# Patient Record
Sex: Female | Born: 1990 | Race: Black or African American | Hispanic: No | Marital: Single | Smoking: Current every day smoker
Health system: Southern US, Community
[De-identification: ages and names within clinical notes are randomized; demographics above are authoritative.]

## PROBLEM LIST (undated history)

## (undated) DIAGNOSIS — M419 Scoliosis, unspecified: Secondary | ICD-10-CM

## (undated) DIAGNOSIS — M069 Rheumatoid arthritis, unspecified: Secondary | ICD-10-CM

## (undated) DIAGNOSIS — M0800 Unspecified juvenile rheumatoid arthritis of unspecified site: Secondary | ICD-10-CM

## (undated) DIAGNOSIS — J45909 Unspecified asthma, uncomplicated: Secondary | ICD-10-CM

## (undated) DIAGNOSIS — A609 Anogenital herpesviral infection, unspecified: Secondary | ICD-10-CM

## (undated) DIAGNOSIS — A749 Chlamydial infection, unspecified: Secondary | ICD-10-CM

## (undated) HISTORY — DX: Scoliosis, unspecified: M41.9

## (undated) HISTORY — DX: Chlamydial infection, unspecified: A74.9

## (undated) HISTORY — DX: Unspecified juvenile rheumatoid arthritis of unspecified site: M08.00

## (undated) HISTORY — DX: Anogenital herpesviral infection, unspecified: A60.9

---
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
## 2012-02-02 ENCOUNTER — Emergency Department (HOSPITAL_COMMUNITY)
Admission: EM | Admit: 2012-02-02 | Discharge: 2012-02-02 | Disposition: A | Discharge status: Nursing Home (Includes ICF) | Payer: Managed Care, Other (non HMO) | Source: Home / Self Care | Attending: Family Medicine | Admitting: Family Medicine

## 2012-02-02 ENCOUNTER — Encounter (HOSPITAL_COMMUNITY): Payer: Self-pay | Admitting: *Deleted

## 2012-02-02 ENCOUNTER — Emergency Department (HOSPITAL_COMMUNITY): Payer: Managed Care, Other (non HMO)

## 2012-02-02 ENCOUNTER — Emergency Department (HOSPITAL_COMMUNITY)
Admission: EM | Admit: 2012-02-02 | Discharge: 2012-02-02 | Disposition: A | Discharge status: Home / Self Care | Payer: Managed Care, Other (non HMO) | Attending: Emergency Medicine | Admitting: Emergency Medicine

## 2012-02-02 ENCOUNTER — Emergency Department (INDEPENDENT_AMBULATORY_CARE_PROVIDER_SITE_OTHER): Payer: Managed Care, Other (non HMO)

## 2012-02-02 DIAGNOSIS — H538 Other visual disturbances: Secondary | ICD-10-CM | POA: Insufficient documentation

## 2012-02-02 DIAGNOSIS — S60221A Contusion of right hand, initial encounter: Secondary | ICD-10-CM

## 2012-02-02 DIAGNOSIS — R221 Localized swelling, mass and lump, neck: Secondary | ICD-10-CM | POA: Insufficient documentation

## 2012-02-02 DIAGNOSIS — H5710 Ocular pain, unspecified eye: Secondary | ICD-10-CM | POA: Insufficient documentation

## 2012-02-02 DIAGNOSIS — S60229A Contusion of unspecified hand, initial encounter: Secondary | ICD-10-CM

## 2012-02-02 DIAGNOSIS — F17200 Nicotine dependence, unspecified, uncomplicated: Secondary | ICD-10-CM | POA: Insufficient documentation

## 2012-02-02 DIAGNOSIS — S0993XA Unspecified injury of face, initial encounter: Secondary | ICD-10-CM

## 2012-02-02 DIAGNOSIS — J45909 Unspecified asthma, uncomplicated: Secondary | ICD-10-CM | POA: Insufficient documentation

## 2012-02-02 DIAGNOSIS — R220 Localized swelling, mass and lump, head: Secondary | ICD-10-CM | POA: Insufficient documentation

## 2012-02-02 DIAGNOSIS — H5789 Other specified disorders of eye and adnexa: Secondary | ICD-10-CM | POA: Insufficient documentation

## 2012-02-02 HISTORY — DX: Unspecified asthma, uncomplicated: J45.909

## 2012-02-02 MED ORDER — HYDROCODONE-ACETAMINOPHEN 5-325 MG PO TABS: 2.0000 | ORAL_TABLET | ORAL | Status: DC | PRN

## 2012-02-02 MED ORDER — TETRACAINE HCL 0.5 % OP SOLN
OPHTHALMIC | Status: AC
  Filled 2012-02-02: qty 2

## 2012-02-02 MED ORDER — HYDROCODONE-ACETAMINOPHEN 5-325 MG PO TABS
2.0000 | ORAL_TABLET | Freq: Once | ORAL | Status: AC
  Administered 2012-02-02: 2 via ORAL
  Filled 2012-02-02: qty 2

## 2012-02-02 MED ORDER — FLUORESCEIN SODIUM 1 MG OP STRP
ORAL_STRIP | OPHTHALMIC | Status: AC
  Administered 2012-02-02: 1
  Filled 2012-02-02: qty 1

## 2012-02-02 NOTE — ED Notes (Signed)
Report called to Desma Paganini, ED First Nurse.

## 2012-02-02 NOTE — ED Provider Notes (Signed)
Medical screening examination/treatment/procedure(s) were performed by resident physician or non-physician practitioner and as supervising physician I was immediately available for consultation/collaboration.   Alison Kubicki DOUGLAS MD.    Azarie Coriz D Willmer Fellers, MD 02/02/12 1940 

## 2012-02-02 NOTE — ED Notes (Signed)
States last tetanus < 5 yrs ago.
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
## 2012-06-20 ENCOUNTER — Encounter: Payer: Managed Care, Other (non HMO) | Admitting: Gynecology

## 2012-06-24 ENCOUNTER — Encounter (HOSPITAL_COMMUNITY): Payer: Self-pay | Admitting: Emergency Medicine

## 2012-06-24 ENCOUNTER — Emergency Department (HOSPITAL_COMMUNITY): Payer: Managed Care, Other (non HMO)

## 2012-06-24 ENCOUNTER — Emergency Department (HOSPITAL_COMMUNITY)
Admission: EM | Admit: 2012-06-24 | Discharge: 2012-06-24 | Disposition: A | Discharge status: Home / Self Care | Payer: Managed Care, Other (non HMO) | Attending: Emergency Medicine | Admitting: Emergency Medicine

## 2012-06-24 DIAGNOSIS — Z79899 Other long term (current) drug therapy: Secondary | ICD-10-CM | POA: Insufficient documentation

## 2012-06-24 DIAGNOSIS — R109 Unspecified abdominal pain: Secondary | ICD-10-CM | POA: Insufficient documentation

## 2012-06-24 DIAGNOSIS — N939 Abnormal uterine and vaginal bleeding, unspecified: Secondary | ICD-10-CM | POA: Insufficient documentation

## 2012-06-24 DIAGNOSIS — N926 Irregular menstruation, unspecified: Secondary | ICD-10-CM | POA: Insufficient documentation

## 2012-06-24 DIAGNOSIS — J45909 Unspecified asthma, uncomplicated: Secondary | ICD-10-CM | POA: Insufficient documentation

## 2012-06-24 DIAGNOSIS — Z3202 Encounter for pregnancy test, result negative: Secondary | ICD-10-CM | POA: Insufficient documentation

## 2012-06-24 DIAGNOSIS — Z8619 Personal history of other infectious and parasitic diseases: Secondary | ICD-10-CM | POA: Insufficient documentation

## 2012-06-24 DIAGNOSIS — M083 Juvenile rheumatoid polyarthritis (seronegative): Secondary | ICD-10-CM | POA: Insufficient documentation

## 2012-06-24 DIAGNOSIS — Z8739 Personal history of other diseases of the musculoskeletal system and connective tissue: Secondary | ICD-10-CM | POA: Insufficient documentation

## 2012-06-24 DIAGNOSIS — Z8679 Personal history of other diseases of the circulatory system: Secondary | ICD-10-CM | POA: Insufficient documentation

## 2012-06-24 DIAGNOSIS — F17200 Nicotine dependence, unspecified, uncomplicated: Secondary | ICD-10-CM | POA: Insufficient documentation

## 2012-06-24 LAB — URINE MICROSCOPIC-ADD ON

## 2012-06-24 LAB — URINALYSIS, ROUTINE W REFLEX MICROSCOPIC
Ketones, ur: NEGATIVE mg/dL
Leukocytes, UA: NEGATIVE
Nitrite: NEGATIVE
Protein, ur: NEGATIVE mg/dL
Urobilinogen, UA: 0.2 mg/dL (ref 0.0–1.0)

## 2012-06-24 LAB — CBC WITH DIFFERENTIAL/PLATELET
Basophils Absolute: 0 10*3/uL (ref 0.0–0.1)
HCT: 44 % (ref 36.0–46.0)
Hemoglobin: 15.4 g/dL — ABNORMAL HIGH (ref 12.0–15.0)
Lymphocytes Relative: 25 % (ref 12–46)
Monocytes Absolute: 0.6 10*3/uL (ref 0.1–1.0)
Monocytes Relative: 7 % (ref 3–12)
Neutro Abs: 5 10*3/uL (ref 1.7–7.7)
WBC: 7.7 10*3/uL (ref 4.0–10.5)

## 2012-06-24 LAB — POCT I-STAT, CHEM 8
HCT: 46 % (ref 36.0–46.0)
Hemoglobin: 15.6 g/dL — ABNORMAL HIGH (ref 12.0–15.0)
Potassium: 3.9 mEq/L (ref 3.5–5.1)
Sodium: 141 mEq/L (ref 135–145)
TCO2: 25 mmol/L (ref 0–100)

## 2012-06-24 LAB — GC/CHLAMYDIA PROBE AMP: GC Probe RNA: NEGATIVE

## 2012-06-24 MED ORDER — IBUPROFEN 600 MG PO TABS: 600.0000 mg | ORAL_TABLET | Freq: Four times a day (QID) | ORAL | Status: DC | PRN

## 2012-06-24 NOTE — ED Provider Notes (Signed)
Medical screening examination/treatment/procedure(s) were performed by non-physician practitioner and as supervising physician I was immediately available for consultation/collaboration.  Ossie Yebra, MD 06/24/12 1447 
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
## 2012-06-24 NOTE — ED Notes (Signed)
States history of heavy menstrual cycle and since Feb 14 this year constant vaginal bleeding and intermittent abdominal pain LLQ and RLQ 3-10/10 cramping.

## 2012-09-03 ENCOUNTER — Ambulatory Visit (INDEPENDENT_AMBULATORY_CARE_PROVIDER_SITE_OTHER): Payer: Managed Care, Other (non HMO) | Admitting: Gynecology

## 2012-09-03 ENCOUNTER — Encounter: Payer: Self-pay | Admitting: Gynecology

## 2012-09-03 DIAGNOSIS — N750 Cyst of Bartholin's gland: Secondary | ICD-10-CM

## 2012-09-03 NOTE — Patient Instructions (Signed)
Use sitz baths with warm water over the next day or 2. Followup if the area becomes more tender, swollen or any other issue.

## 2012-09-03 NOTE — Progress Notes (Signed)
Patient ID: Diane Glass, female   DOB: 10-01-1990, 22 y.o.   MRN: [id number] Patient presents with several day history of noticing swelling around the right area of her vagina. It is not painful nor draining. Never had this before.  Exam with Selena Batten assistant External BUS vagina with Bartholin's cyst right lower labia/perineal body. 2-3 cm nontender. No inguinal adenopathy or tenderness. Vaginal exam without other abnormalities. Cervix normal. Uterus normal size mobile nontender. Adnexa without masses or tenderness. Physical Exam  Genitourinary:      Procedure note: Area on inner lower aspect right labium minora/posterior fourchette was cleansed with Betadine a pointing area was infiltrated using 1% lidocaine and a stab incision made with a scalpel. Clear mucoid material extruded until the cyst was completely emptied. No residual mass.  Assessment and plan: Right Bartholin's cyst, incised and drained. No residual mass. Postoperative instructions given. Followup if recurrence. Discussed possible marsupialization if it does become recurrent.

## 2012-10-01 ENCOUNTER — Encounter: Payer: Self-pay | Admitting: Gynecology

## 2012-10-01 ENCOUNTER — Ambulatory Visit (INDEPENDENT_AMBULATORY_CARE_PROVIDER_SITE_OTHER): Payer: Managed Care, Other (non HMO) | Admitting: Gynecology

## 2012-10-01 VITALS — BP 112/66 | Ht 62.0 in | Wt 123.0 lb

## 2012-10-01 DIAGNOSIS — Z113 Encounter for screening for infections with a predominantly sexual mode of transmission: Secondary | ICD-10-CM

## 2012-10-01 DIAGNOSIS — N750 Cyst of Bartholin's gland: Secondary | ICD-10-CM

## 2012-10-01 DIAGNOSIS — R21 Rash and other nonspecific skin eruption: Secondary | ICD-10-CM

## 2012-10-01 DIAGNOSIS — Z01419 Encounter for gynecological examination (general) (routine) without abnormal findings: Secondary | ICD-10-CM

## 2012-10-01 LAB — COMPREHENSIVE METABOLIC PANEL
ALT: 8 U/L (ref 0–35)
AST: 14 U/L (ref 0–37)
Albumin: 4.7 g/dL (ref 3.5–5.2)
Alkaline Phosphatase: 58 U/L (ref 39–117)
BUN: 10 mg/dL (ref 6–23)
CO2: 27 mEq/L (ref 19–32)
Calcium: 9.6 mg/dL (ref 8.4–10.5)
Chloride: 106 mEq/L (ref 96–112)
Creat: 0.69 mg/dL (ref 0.50–1.10)
Glucose, Bld: 70 mg/dL (ref 70–99)
Potassium: 4.5 mEq/L (ref 3.5–5.3)
Sodium: 141 mEq/L (ref 135–145)
Total Bilirubin: 0.3 mg/dL (ref 0.3–1.2)
Total Protein: 7 g/dL (ref 6.0–8.3)

## 2012-10-01 LAB — CBC WITH DIFFERENTIAL/PLATELET
Basophils Absolute: 0.1 10*3/uL (ref 0.0–0.1)
Basophils Relative: 1 % (ref 0–1)
Eosinophils Absolute: 0.2 10*3/uL (ref 0.0–0.7)
Eosinophils Relative: 3 % (ref 0–5)
HCT: 42.5 % (ref 36.0–46.0)
Hemoglobin: 14.8 g/dL (ref 12.0–15.0)
Lymphocytes Relative: 26 % (ref 12–46)
Lymphs Abs: 1.9 10*3/uL (ref 0.7–4.0)
MCH: 31 pg (ref 26.0–34.0)
MCHC: 34.8 g/dL (ref 30.0–36.0)
MCV: 88.9 fL (ref 78.0–100.0)
Monocytes Absolute: 0.5 10*3/uL (ref 0.1–1.0)
Monocytes Relative: 7 % (ref 3–12)
Neutro Abs: 4.5 10*3/uL (ref 1.7–7.7)
Neutrophils Relative %: 63 % (ref 43–77)
Platelets: 247 10*3/uL (ref 150–400)
RBC: 4.78 MIL/uL (ref 3.87–5.11)
RDW: 13.4 % (ref 11.5–15.5)
WBC: 7.2 10*3/uL (ref 4.0–10.5)

## 2012-10-01 LAB — HEPATITIS B SURFACE ANTIGEN: Hepatitis B Surface Ag: NEGATIVE

## 2012-10-01 LAB — HIV ANTIBODY (ROUTINE TESTING W REFLEX): HIV: NONREACTIVE

## 2012-10-01 LAB — RPR

## 2012-10-01 LAB — HEPATITIS C ANTIBODY: HCV Ab: NEGATIVE

## 2012-10-01 LAB — TSH: TSH: 0.582 u[IU]/mL (ref 0.350–4.500)

## 2012-10-01 NOTE — Patient Instructions (Addendum)
Call if you want to schedule surgery for the Bartholin's cyst. Followup for Gardasil series if you choose. Followup for alternative birth control discussion if you choose. Followup for lab results on Mychart. Followup in one year for annual exam.

## 2012-10-01 NOTE — Progress Notes (Signed)
Diane Glass [birth year] [id number]        22 y.o.  G0P0 for annual exam.  Several issues noted below.  Past medical history,surgical history, medications, allergies, family history and social history were all reviewed and documented in the EPIC chart.  ROS:  Performed and pertinent positives and negatives are included in the history, assessment and plan .  Exam: Kim assistant Filed Vitals:   10/01/12 1406  BP: 112/66  Height: 5\' 2"  (1.575 m)  Weight: 123 lb (55.792 kg)   General appearance  Normal Skin grossly normal excepting small crusting rash several millimeters across the lower anterior left shin Head/Neck normal with no cervical or supraclavicular adenopathy thyroid normal Lungs  clear Cardiac RR, without RMG Abdominal  soft, nontender, without masses, organomegaly or hernia Breasts  examined lying and sitting without masses, retractions, discharge or axillary adenopathy. Pelvic  Ext/BUS/vagina  2 cm soft classic Bartholin cyst right lower introital opening   Cervix  normal GC/Chlamydia  Uterus  anteverted, normal size, shape and contour, midline and mobile nontender   Adnexa  Without masses or tenderness    Anus and perineum  normal       Assessment/Plan:  22 y.o. G0P0 female for annual exam, regular menses, condom birth control  1. Barrier contraception. I again reviewed with her the issues and risks of barrier contraception to include failure. I reviewed options and patient declined. She is comfortable with condoms and accepts and understands the failure risk. Availability of plan B. discussed. 2. Right Bartholin cyst. Was opened mid May and drained. Patient said that reaccumulated then spontaneously drained once and now has reaccumulated. Does not hurt is not overly bothersome to her. Options of continued observation as long as it stays stable versus marsupialization discussed. I reviewed which involved with marsupialization and the patient will decide if she wants to  proceed with this. She does she knows to call me. 3. Rash left shin. Present for 2 weeks. Questionable poison ivy historically. Recommended OTC 1% hydrocortisone cream 3 times a day. If it persists recommended following up with dermatology. 4. STD screening. Patient requests STD screening. No known exposure but just wants to be screened. GC/Chlamydia, HIV, RPR, hepatitis B, hepatitis C done. 5. Pap smear. Will initiate at age 30 per current screening guidelines. 6. Breast health. SBE monthly reviewed. 7. Gardasil vaccine. I again reviewed availability and recommendation for Gardasil vaccine. Patient stated that she is reviewed this before and is not interested at this time. I've encouraged her to consider this and follow up if she wants to pursue the series. 8. Health maintenance. Baseline CBC comprehensive metabolic panel TSH urinalysis ordered. Due to her rheumatoid arthritis I ordered a TSH to rule out thyroid dysfunction and the glucose to rule out diabetes. We'll hold on lipids given her age and otherwise health status.    Dara Lords MD, 2:49 PM 10/01/2012

## 2012-10-02 LAB — URINALYSIS W MICROSCOPIC + REFLEX CULTURE
Hgb urine dipstick: NEGATIVE
Ketones, ur: NEGATIVE mg/dL
Nitrite: POSITIVE — AB
Urobilinogen, UA: 0.2 mg/dL (ref 0.0–1.0)

## 2012-10-02 LAB — GC/CHLAMYDIA PROBE AMP: GC Probe RNA: NEGATIVE

## 2012-10-04 LAB — URINE CULTURE

## 2012-10-06 ENCOUNTER — Other Ambulatory Visit: Payer: Self-pay | Admitting: Gynecology

## 2012-10-06 MED ORDER — NITROFURANTOIN MONOHYD MACRO 100 MG PO CAPS: 100.0000 mg | ORAL_CAPSULE | Freq: Two times a day (BID) | ORAL | Status: DC

## 2012-10-06 MED ORDER — SULFAMETHOXAZOLE-TRIMETHOPRIM 800-160 MG PO TABS: 1.0000 | ORAL_TABLET | Freq: Two times a day (BID) | ORAL | Status: AC

## 2012-10-07 ENCOUNTER — Other Ambulatory Visit: Payer: Self-pay | Admitting: Gynecology

## 2012-10-07 DIAGNOSIS — N390 Urinary tract infection, site not specified: Secondary | ICD-10-CM

## 2013-02-03 ENCOUNTER — Ambulatory Visit (INDEPENDENT_AMBULATORY_CARE_PROVIDER_SITE_OTHER): Payer: Managed Care, Other (non HMO) | Admitting: Gynecology

## 2013-02-03 ENCOUNTER — Encounter: Payer: Self-pay | Admitting: Gynecology

## 2013-02-03 DIAGNOSIS — Z113 Encounter for screening for infections with a predominantly sexual mode of transmission: Secondary | ICD-10-CM

## 2013-02-03 DIAGNOSIS — N750 Cyst of Bartholin's gland: Secondary | ICD-10-CM

## 2013-02-03 DIAGNOSIS — N72 Inflammatory disease of cervix uteri: Secondary | ICD-10-CM

## 2013-02-03 DIAGNOSIS — N765 Ulceration of vagina: Secondary | ICD-10-CM

## 2013-02-03 MED ORDER — VALACYCLOVIR HCL 500 MG PO TABS: 500.0000 mg | ORAL_TABLET | Freq: Two times a day (BID) | ORAL | Status: DC

## 2013-02-03 MED ORDER — VALACYCLOVIR HCL 1 G PO TABS: 1000.0000 mg | ORAL_TABLET | Freq: Two times a day (BID) | ORAL | Status: DC

## 2013-02-03 NOTE — Progress Notes (Signed)
Patient presents complaining of a swollen tender area on her vulva. Also a recurrence of her right Bartholin's cyst that was I&D May 2014. She she did request an STD check although has no known exposure wants to make sure.  Exam with Selena Batten Assistant Abdomen soft nontender without masses guarding rebound organomegaly. External BUS vagina with swollen tender right inguinal lymph node the overlying changes to suggest cellulitis/boil. Tender ulcer left of her urethral opening. 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 Vagina normal. Cervix normal. Uterus normal size midline mobile nontender. Adnexa without masses or tenderness  Assessment and plan: 1. 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 Swollen right inguinal lymph node most likely secondary to HSV although on the contralateral side. PCR HSV screen taken. Initiate Valtrex 1 g twice a day x10 days. Followup for PCR results. If positive patient understands implications. Options for daily suppressive therapy to prevent recurrence and possible transmission risk versus observation with reporting anymore outbreaks discussed. If negative then recommend observation with reporting of any recurrent outbreaks. 2. Recurrent right Bartholin's cyst. Not overly bothersome to the patient and she prefers observation at this time. Options for marsupialization or lancing discussed and declined. 3. STD screening. GC/Chlamydia of the cervix, RPR, HIV, hepatitis B hepatitis C ordered.

## 2013-02-03 NOTE — Patient Instructions (Signed)
Follow-up for lab results

## 2013-02-04 LAB — GC/CHLAMYDIA PROBE AMP: CT Probe RNA: POSITIVE — AB

## 2013-02-05 ENCOUNTER — Other Ambulatory Visit: Payer: Self-pay | Admitting: Women's Health

## 2013-02-05 MED ORDER — AZITHROMYCIN 250 MG PO TABS: 1000.0000 mg | ORAL_TABLET | Freq: Once | ORAL | Status: DC

## 2013-02-09 ENCOUNTER — Telehealth: Payer: Self-pay | Admitting: Gynecology

## 2013-02-09 ENCOUNTER — Encounter: Payer: Self-pay | Admitting: Gynecology

## 2013-02-09 NOTE — Telephone Encounter (Signed)
Tell patient that her herpes culture was positive. After she takes the current Valtrex prescription her options would be to go on Valtrex 500 mg daily as a suppressive dose to prevent any recurrence or possible transmission to partners. The other option is to wait and see if she does have recurrence and call and then we'll deal with it at that time. It is her choice. And we talked about this at her office visit. If she chooses a suppressive dose then the prescription is Valtrex 500 mg #30 one by mouth daily refill x6.

## 2013-02-09 NOTE — Telephone Encounter (Signed)
Pt informed with the below note, she does not want Rx now and will call back if needed.

## 2013-03-09 ENCOUNTER — Ambulatory Visit: Payer: Managed Care, Other (non HMO) | Admitting: Gynecology

## 2013-03-18 ENCOUNTER — Telehealth: Payer: Self-pay

## 2013-03-18 MED ORDER — VALACYCLOVIR HCL 500 MG PO TABS: 500.0000 mg | ORAL_TABLET | Freq: Every day | ORAL | Status: DC

## 2013-03-18 NOTE — Telephone Encounter (Signed)
Patient wants daily suppressive Valtrex Rx called in. Per Dr. Velvet Bathe last note Rx sent to pharmacy.

## 2013-04-24 ENCOUNTER — Emergency Department (HOSPITAL_COMMUNITY)
Admission: EM | Admit: 2013-04-24 | Discharge: 2013-04-24 | Disposition: A | Discharge status: Home / Self Care | Payer: Managed Care, Other (non HMO) | Attending: Emergency Medicine | Admitting: Emergency Medicine

## 2013-04-24 ENCOUNTER — Encounter (HOSPITAL_COMMUNITY): Payer: Self-pay | Admitting: Emergency Medicine

## 2013-04-24 DIAGNOSIS — F17200 Nicotine dependence, unspecified, uncomplicated: Secondary | ICD-10-CM | POA: Insufficient documentation

## 2013-04-24 DIAGNOSIS — J45909 Unspecified asthma, uncomplicated: Secondary | ICD-10-CM | POA: Insufficient documentation

## 2013-04-24 DIAGNOSIS — R6889 Other general symptoms and signs: Secondary | ICD-10-CM

## 2013-04-24 DIAGNOSIS — M549 Dorsalgia, unspecified: Secondary | ICD-10-CM | POA: Insufficient documentation

## 2013-04-24 DIAGNOSIS — Z79899 Other long term (current) drug therapy: Secondary | ICD-10-CM | POA: Insufficient documentation

## 2013-04-24 DIAGNOSIS — R1031 Right lower quadrant pain: Secondary | ICD-10-CM | POA: Insufficient documentation

## 2013-04-24 DIAGNOSIS — Z8619 Personal history of other infectious and parasitic diseases: Secondary | ICD-10-CM | POA: Insufficient documentation

## 2013-04-24 DIAGNOSIS — Z3202 Encounter for pregnancy test, result negative: Secondary | ICD-10-CM | POA: Insufficient documentation

## 2013-04-24 DIAGNOSIS — J111 Influenza due to unidentified influenza virus with other respiratory manifestations: Secondary | ICD-10-CM | POA: Insufficient documentation

## 2013-04-24 DIAGNOSIS — R112 Nausea with vomiting, unspecified: Secondary | ICD-10-CM | POA: Insufficient documentation

## 2013-04-24 DIAGNOSIS — N949 Unspecified condition associated with female genital organs and menstrual cycle: Secondary | ICD-10-CM | POA: Insufficient documentation

## 2013-04-24 DIAGNOSIS — M083 Juvenile rheumatoid polyarthritis (seronegative): Secondary | ICD-10-CM | POA: Insufficient documentation

## 2013-04-24 DIAGNOSIS — Z8679 Personal history of other diseases of the circulatory system: Secondary | ICD-10-CM | POA: Insufficient documentation

## 2013-04-24 LAB — CBC WITH DIFFERENTIAL/PLATELET
BASOS ABS: 0 10*3/uL (ref 0.0–0.1)
BASOS PCT: 0 % (ref 0–1)
Eosinophils Absolute: 0.2 10*3/uL (ref 0.0–0.7)
Eosinophils Relative: 2 % (ref 0–5)
HEMATOCRIT: 43 % (ref 36.0–46.0)
Hemoglobin: 15.2 g/dL — ABNORMAL HIGH (ref 12.0–15.0)
LYMPHS PCT: 15 % (ref 12–46)
Lymphs Abs: 1.5 10*3/uL (ref 0.7–4.0)
MCH: 31 pg (ref 26.0–34.0)
MCHC: 35.3 g/dL (ref 30.0–36.0)
MCV: 87.8 fL (ref 78.0–100.0)
MONO ABS: 0.7 10*3/uL (ref 0.1–1.0)
Monocytes Relative: 7 % (ref 3–12)
NEUTROS ABS: 7.5 10*3/uL (ref 1.7–7.7)
Neutrophils Relative %: 75 % (ref 43–77)
PLATELETS: 187 10*3/uL (ref 150–400)
RBC: 4.9 MIL/uL (ref 3.87–5.11)
RDW: 13.4 % (ref 11.5–15.5)
WBC: 10 10*3/uL (ref 4.0–10.5)

## 2013-04-24 LAB — COMPREHENSIVE METABOLIC PANEL
ALBUMIN: 4.4 g/dL (ref 3.5–5.2)
ALT: 10 U/L (ref 0–35)
AST: 13 U/L (ref 0–37)
Alkaline Phosphatase: 60 U/L (ref 39–117)
BILIRUBIN TOTAL: 0.5 mg/dL (ref 0.3–1.2)
BUN: 9 mg/dL (ref 6–23)
CALCIUM: 9.3 mg/dL (ref 8.4–10.5)
CHLORIDE: 103 meq/L (ref 96–112)
CO2: 25 mEq/L (ref 19–32)
CREATININE: 0.6 mg/dL (ref 0.50–1.10)
GFR calc Af Amer: >90 mL/min (ref >90)
GFR calc non Af Amer: >90 mL/min (ref >90)
Glucose, Bld: 85 mg/dL (ref 70–99)
Potassium: 4.1 mEq/L (ref 3.7–5.3)
SODIUM: 142 meq/L (ref 137–147)
Total Protein: 7.6 g/dL (ref 6.0–8.3)

## 2013-04-24 LAB — URINALYSIS, ROUTINE W REFLEX MICROSCOPIC
BILIRUBIN URINE: NEGATIVE
GLUCOSE, UA: NEGATIVE mg/dL
Ketones, ur: NEGATIVE mg/dL
Leukocytes, UA: NEGATIVE
Nitrite: NEGATIVE
Protein, ur: NEGATIVE mg/dL
SPECIFIC GRAVITY, URINE: 1.016 (ref 1.005–1.030)
Urobilinogen, UA: 0.2 mg/dL (ref 0.0–1.0)
pH: 6.5 (ref 5.0–8.0)

## 2013-04-24 LAB — URINE MICROSCOPIC-ADD ON

## 2013-04-24 LAB — LIPASE, BLOOD: LIPASE: 19 U/L (ref 11–59)

## 2013-04-24 LAB — POCT PREGNANCY, URINE: PREG TEST UR: NEGATIVE

## 2013-04-24 MED ORDER — PSEUDOEPHEDRINE HCL 30 MG PO TABS: 30.0000 mg | ORAL_TABLET | ORAL | Status: DC | PRN

## 2013-04-24 MED ORDER — ONDANSETRON HCL 4 MG PO TABS: 4.0000 mg | ORAL_TABLET | Freq: Four times a day (QID) | ORAL | Status: DC | PRN

## 2013-04-24 MED ORDER — BENZONATATE 100 MG PO CAPS: 100.0000 mg | ORAL_CAPSULE | Freq: Three times a day (TID) | ORAL | Status: DC

## 2013-04-24 MED ORDER — ONDANSETRON 4 MG PO TBDP
4.0000 mg | ORAL_TABLET | Freq: Once | ORAL | Status: DC
  Filled 2013-04-24: qty 1

## 2013-04-24 NOTE — ED Notes (Signed)
Patient states she has had nausea and vomiting x 6 days.  Patient states lymph is swollen near R ear.   Patient states abdominal pain.

## 2013-04-24 NOTE — Discharge Instructions (Signed)
Start taking sudafed for congestion as prescribed. Ibuprofen or tylenol for pain. Zofran as prescribed for nausea. Tessalon for cough. Follow up with primary care doctor.   Influenza A (H1N1) H1N1 formerly called "swine flu" is a new influenza virus causing sickness in people. The H1N1 virus is different from seasonal influenza viruses. However, the H1N1 symptoms are similar to seasonal influenza and it is spread from person to person. You may be at higher risk for serious problems if you have underlying serious medical conditions. The CDC and the [employer] are following reported cases around the world. CAUSES   The flu is thought to spread mainly person-to-person through coughing or sneezing of infected people.  A person may become infected by touching something with the virus on it and then touching their mouth or nose. SYMPTOMS   Fever.  Headache.  Tiredness.  Cough.  Sore throat.  Runny or stuffy nose.  Body aches.  Diarrhea and vomiting These symptoms are referred to as "flu-like symptoms." A lot of different illnesses, including the common cold, may have similar symptoms. DIAGNOSIS   There are tests that can tell if you have the H1N1 virus.  Confirmed cases of H1N1 will be reported to the state or local health department.  A doctor's exam may be needed to tell whether you have an infection that is a complication of the flu. HOME CARE INSTRUCTIONS   Stay informed. Visit the [facility] website for current recommendations. Visit [employer]. You may also call 1-800-CDC-INFO (403-523-4950).  Get help early if you develop any of the above symptoms.  If you are at high risk from complications of the flu, talk to your caregiver as soon as you develop flu-like symptoms. Those at higher risk for complications include:  People 65 years or older.  People with chronic medical conditions.  Pregnant women.  Young children.  Your caregiver may recommend  antiviral medicine to help treat the flu.  If you get the flu, get plenty of rest, drink enough water and fluids to keep your urine clear or pale yellow, and avoid using alcohol or tobacco.  You may take over-the-counter medicine to relieve the symptoms of the flu if your caregiver approves. (Never give aspirin to children or teenagers who have flu-like symptoms, particularly fever). TREATMENT  If you do get sick, antiviral drugs are available. These drugs can make your illness milder and make you feel better faster. Treatment should start soon after illness starts. It is only effective if taken within the first day of becoming ill. Only your caregiver can prescribe antiviral medication.  PREVENTION   Cover your nose and mouth with a tissue or your arm when you cough or sneeze. Throw the tissue away.  Wash your hands often with soap and warm water, especially after you cough or sneeze. Alcohol-based cleaners are also effective against germs.  Avoid touching your eyes, nose or mouth. This is one way germs spread.  Try to avoid contact with sick people. Follow public health advice regarding school closures. Avoid crowds.  Stay home if you get sick. Limit contact with others to keep from infecting them. People infected with the H1N1 virus may be able to infect others anywhere from 1 day before feeling sick to 5-7 days after getting flu symptoms.  An H1N1 vaccine is available to help protect against the virus. In addition to the H1N1 vaccine, you will need to be vaccinated for seasonal influenza. The H1N1 and seasonal vaccines may be given on the same  day. The CDC especially recommends the H1N1 vaccine for:  Pregnant women.  People who live with or care for children younger than 29 months of age.  Health care and emergency services personnel.  Persons between the ages of 29 months through 59 years of age.  People from ages 61 through 38 years who are at higher risk for H1N1 because of chronic  health disorders or immune system problems. FACEMASKS In community and home settings, the use of facemasks and N95 respirators are not normally recommended. In certain circumstances, a facemask or N95 respirator may be used for persons at increased risk of severe illness from influenza. Your caregiver can give additional recommendations for facemask use. IN CHILDREN, EMERGENCY WARNING SIGNS THAT NEED URGENT MEDICAL CARE:  Fast breathing or trouble breathing.  Bluish skin color.  Not drinking enough fluids.  Not waking up or not interacting normally.  Being so fussy that the child does not want to be held.  Your child has an oral temperature above 102 F (38.9 C), not controlled by medicine.  Your baby is older than 3 months with a rectal temperature of 102 F (38.9 C) or higher.  Your baby is 11 months old or younger with a rectal temperature of 100.4 F (38 C) or higher.  Flu-like symptoms improve but then return with fever and worse cough. IN ADULTS, EMERGENCY WARNING SIGNS THAT NEED URGENT MEDICAL CARE:  Difficulty breathing or shortness of breath.  Pain or pressure in the chest or abdomen.  Sudden dizziness.  Confusion.  Severe or persistent vomiting.  Bluish color.  You have a oral temperature above 102 F (38.9 C), not controlled by medicine.  Flu-like symptoms improve but return with fever and worse cough. SEEK IMMEDIATE MEDICAL CARE IF:  You or someone you know is experiencing any of the above symptoms. When you arrive at the emergency center, report that you think you have the flu. You may be asked to wear a mask and/or sit in a secluded area to protect others from getting sick. MAKE SURE YOU:   Understand these instructions.  Will watch your condition.  Will get help right away if you are not doing well or get worse. Some of this information courtesy of the CDC.  Document Released: 09/26/2007 Document Revised: 07/02/2011 Document Reviewed:  09/26/2007 [facility] Patient Information 2014 [place], [place].

## 2013-04-24 NOTE — ED Notes (Addendum)
Pt is unable to give urine specimen at this time. The patient has been advised to use call light for assistance to the restroom. The tech has reported the RN in charge.

## 2013-04-24 NOTE — ED Provider Notes (Signed)
Medical screening examination/treatment/procedure(s) were performed by non-physician practitioner and as supervising physician I was immediately available for consultation/collaboration.  EKG Interpretation   None         Gwyneth Sprout, MD 04/24/13 1849

## 2013-04-24 NOTE — ED Provider Notes (Signed)
CSN: [id number]     Arrival date & time 04/24/13  1010 History   First MD Initiated Contact with Patient 04/24/13 1113     Chief Complaint  Patient presents with  . Abdominal Pain  . Emesis  . Nasal Congestion  . Nausea   (Consider location/radiation/quality/duration/timing/severity/associated sxs/prior Treatment) HPI Diane Glass is a 23 y.o. female with history of rheumatoid arthritis who presents to emergency department complaining of body aches, nausea, vomiting, nasal congestion, sore throat, cough. Patient states that her cold symptoms began a week ago and states that nausea vomiting has been there for last 2 days. Patient states that she has not had any diarrhea. Patient states she did not take any medications for her symptoms, states that "I don't like taking medicines." Patient also reports swelling around right ear and pain in the right ear. Patient also reports generalized back pain and lower abdominal pain. Patient states her back pain is always there and is worse when she is ill, states she had a scoliosis repair as a child. Patient also states that her abdominal pain is due to polycystic ovarian disease and she has had same pain in the past and this feels exactly the same. She denies any recent travel or ill contacts.     Past Medical History  Diagnosis Date  . JRA (juvenile rheumatoid arthritis)   . Scoliosis   . Chlamydia 03/2011, 10/2011  . Migraine   . Asthma   . HSV (herpes simplex virus) anogenital infection 10 after her/'2014   Past Surgical History  Procedure Laterality Date  . Scoliosis surgery  2009   Family History  Problem Relation Age of Onset  . Cancer Other     unsure    History  Substance Use Topics  . Smoking status: Current Every Day Smoker -- 0.50 packs/day    Types: Cigarettes  . Smokeless tobacco: Never Used     Comment: two cigarettes a day  . Alcohol Use: No   OB History   Grav Para Term Preterm Abortions TAB SAB Ect Mult Living   0               Review of Systems  Constitutional: Positive for chills and appetite change. Negative for fever.  HENT: Positive for congestion, ear pain and sore throat.   Respiratory: Positive for cough. Negative for chest tightness and shortness of breath.   Cardiovascular: Negative for chest pain, palpitations and leg swelling.  Gastrointestinal: Positive for nausea, vomiting and abdominal pain. Negative for diarrhea.  Genitourinary: Positive for pelvic pain. Negative for dysuria, flank pain, vaginal bleeding, vaginal discharge and vaginal pain.  Musculoskeletal: Positive for arthralgias and myalgias. Negative for neck pain and neck stiffness.  Skin: Negative for rash.  Neurological: Negative for dizziness, weakness and headaches.  All other systems reviewed and are negative.    Allergies  Review of patient's allergies indicates no known allergies.  Home Medications   Current Outpatient Rx  Name  Route  Sig  Dispense  Refill  . albuterol (PROVENTIL HFA;VENTOLIN HFA) 108 (90 BASE) MCG/ACT inhaler   Inhalation   Inhale 1 puff into the lungs every 6 (six) hours as needed. For shortness of breath         . azithromycin (ZITHROMAX) 250 MG tablet   Oral   Take 4 tablets (1,000 mg total) by mouth once.   4 tablet   0   . ibuprofen (ADVIL,MOTRIN) 600 MG tablet   Oral   Take 1 tablet (600  mg total) by mouth every 6 (six) hours as needed for pain.   30 tablet   0   . valACYclovir (VALTREX) 500 MG tablet   Oral   Take 1 tablet (500 mg total) by mouth daily.   90 tablet   1    BP 118/74  Pulse 82  Temp(Src) 98.9 F (37.2 C) (Oral)  Resp 18  Ht 5\' 2"  (1.575 m)  Wt 132 lb (59.875 kg)  BMI 24.14 kg/m2  SpO2 99%  LMP 04/23/2013 Physical Exam  Nursing note and vitals reviewed. Constitutional: She appears well-developed and well-nourished. No distress.  HENT:  Head: Normocephalic and atraumatic.  Right Ear: Tympanic membrane, external ear and ear canal normal.  Left Ear:  Tympanic membrane, external ear and ear canal normal.  Nose: Rhinorrhea present.  Mouth/Throat: Uvula is midline, oropharynx is clear and moist and mucous membranes are normal.  Clear fluid behind bilateral TMs. Right periauricular tender lymphadenopathy  Eyes: Conjunctivae are normal.  Neck: Neck supple.  Cardiovascular: Normal rate, regular rhythm and normal heart sounds.   Pulmonary/Chest: Effort normal and breath sounds normal. No respiratory distress. She has no wheezes. She has no rales.  Abdominal: Soft. Bowel sounds are normal. She exhibits no distension. There is tenderness. There is no rebound.  RLQ tenderness. No guarding  Musculoskeletal: She exhibits no edema.  Neurological: She is alert.  Skin: Skin is warm and dry.  Psychiatric: She has a normal mood and affect. Her behavior is normal.    ED Course  Procedures (including critical care time) Labs Review Labs Reviewed  CBC WITH DIFFERENTIAL - Abnormal; Notable for the following:    Hemoglobin 15.2 (*)    All other components within normal limits  URINALYSIS, ROUTINE W REFLEX MICROSCOPIC - Abnormal; Notable for the following:    Hgb urine dipstick TRACE (*)    All other components within normal limits  COMPREHENSIVE METABOLIC PANEL  LIPASE, BLOOD  URINE MICROSCOPIC-ADD ON  POCT PREGNANCY, URINE   Imaging Review No results found.  EKG Interpretation   None       MDM   1. Flu-like symptoms   2. Nausea & vomiting     Patient with flulike symptoms as well as nausea and vomiting. Diffuse abdominal pain was worse pain in the right lower quadrant which patient states is constant do to her polycystic ovarian syndrome. Will get blood work, UA, we'll administer fluids. Zofran given for nausea.  2:05 PM Lab work unremarkable. UA negative for infection. Patient is not pregnant. Vital signs have remained normal. Patient is not nauseated or not vomiting. She is tolerating by mouth fluids in emergency department. I do  suspect patient most likely has a viral infection possibly influenza. She is out of the window for Tamiflu. Will treat her symptomatically with decongestants, cough medication, Motrin or Tylenol for pain and fever, Zofran for nausea. She is to followup with primary care Dr. for her symptoms are not improving in 2 days. She is to return if her symptoms are worsening. Patient agrees with the plan.  Filed Vitals:   04/24/13 1024 04/24/13 1304 04/24/13 1309 04/24/13 1313  BP: 118/74 96/43 93/66  103/68  Pulse: 82 72    Temp: 98.9 F (37.2 C)     TempSrc: Oral     Resp: 18 16    Height:      Weight:      SpO2: 99% 100%         Shatori Bertucci A Kingslee Mairena, PA-C 04/24/13 1407

## 2013-06-14 ENCOUNTER — Encounter (HOSPITAL_COMMUNITY): Payer: Self-pay | Admitting: Emergency Medicine

## 2013-06-14 ENCOUNTER — Emergency Department (INDEPENDENT_AMBULATORY_CARE_PROVIDER_SITE_OTHER)
Admission: EM | Admit: 2013-06-14 | Discharge: 2013-06-14 | Disposition: A | Discharge status: Home / Self Care | Payer: Managed Care, Other (non HMO) | Source: Home / Self Care | Attending: Family Medicine | Admitting: Family Medicine

## 2013-06-14 DIAGNOSIS — B0089 Other herpesviral infection: Secondary | ICD-10-CM

## 2013-06-14 NOTE — ED Provider Notes (Signed)
CSN: [id number]     Arrival date & time 06/14/13  1422 History   First MD Initiated Contact with Patient 06/14/13 1511     Chief Complaint  Patient presents with  . Rash     (Consider location/radiation/quality/duration/timing/severity/associated sxs/prior Treatment) HPI Comments: Patient presents with a rash to her right shoulder. She admits to "stinging" and "burning", but denies pruritis. She carries a history of both Herpes Zoster and HSV, and has been treated for this in the past. She denies recent fever, chills, N,V or weight loss. She did have the FLU right before the breakout occured  Patient is a 23 y.o. female presenting with rash. The history is provided by the patient.  Rash   Past Medical History  Diagnosis Date  . JRA (juvenile rheumatoid arthritis)   . Scoliosis   . Chlamydia 03/2011, 10/2011  . Migraine   . Asthma   . HSV (herpes simplex virus) anogenital infection 10 after her/'2014   Past Surgical History  Procedure Laterality Date  . Scoliosis surgery  2009   Family History  Problem Relation Age of Onset  . Cancer Other     unsure    History  Substance Use Topics  . Smoking status: Current Every Day Smoker -- 0.50 packs/day    Types: Cigarettes  . Smokeless tobacco: Never Used     Comment: two cigarettes a day  . Alcohol Use: No   OB History   Grav Para Term Preterm Abortions TAB SAB Ect Mult Living   0              Review of Systems  Skin: Positive for rash.  All other systems reviewed and are negative.      Allergies  Review of patient's allergies indicates no known allergies.  Home Medications   Current Outpatient Rx  Name  Route  Sig  Dispense  Refill  . albuterol (PROVENTIL HFA;VENTOLIN HFA) 108 (90 BASE) MCG/ACT inhaler   Inhalation   Inhale 1 puff into the lungs every 6 (six) hours as needed. For shortness of breath         . benzonatate (TESSALON) 100 MG capsule   Oral   Take 1 capsule (100 mg total) by mouth every 8  (eight) hours.   21 capsule   0   . ibuprofen (ADVIL,MOTRIN) 600 MG tablet   Oral   Take 1 tablet (600 mg total) by mouth every 6 (six) hours as needed for pain.   30 tablet   0   . ondansetron (ZOFRAN) 4 MG tablet   Oral   Take 1 tablet (4 mg total) by mouth every 6 (six) hours as needed for nausea or vomiting.   20 tablet   0   . pseudoephedrine (SUDAFED) 30 MG tablet   Oral   Take 1 tablet (30 mg total) by mouth every 4 (four) hours as needed for congestion.   30 tablet   0   . valACYclovir (VALTREX) 500 MG tablet   Oral   Take 1 tablet (500 mg total) by mouth daily.   90 tablet   1    BP 106/68  Pulse 59  Temp(Src) 97.7 F (36.5 C) (Oral)  Resp 16  SpO2 100%  LMP 05/24/2013 Physical Exam  Nursing note and vitals reviewed. Constitutional: She is oriented to person, place, and time. She appears well-developed and well-nourished. No distress.  HENT:  Head: Normocephalic and atraumatic.  Pulmonary/Chest: Effort normal.  Musculoskeletal:  Chronic deformities consistent with  RA  Neurological: She is alert and oriented to person, place, and time.  Skin: Skin is warm and dry. Rash noted. She is not diaphoretic. There is erythema.  Tiny grouped  vessicles on upper right shoulder with erythematous base  Psychiatric: Her behavior is normal.    ED Course  Procedures (including critical care time) Labs Review Labs Reviewed - No data to display Imaging Review No results found.    MDM   Final diagnoses:  Herpes dermatitis    Known history with this most c/w HSV. Patient has antivirals at home. Will instruct her to take 1000mg  every 8 hours for 7 days. Not have a Rheumatologist and is encouraged to establish care to prevent further complications from her RA.     , PA-C 06/14/13 [address], PA-C 06/14/13 1614

## 2013-06-14 NOTE — Discharge Instructions (Signed)
Herpes Simplex Virus Herpes simplex virus is a viral infection that may infect many different areas of the body, such as the genitalia and mouth. There are two different strains of the virus: herpes simplex virus 1 (HSV-1) and herpes simplex virus 2 (HSV-2). HSV-1 is typically associated with infections of the mouth and lips. HSV-2 is associated with infections of the genitals. However, either strain of the virus may infect any area. HSV may be spread through saliva particles or sexual contact. One unusual form of HSV-1, known as herpes gladiatorum, is passed from skin-to-skin contact, such as in wrestling. SYMPTOMS   Sometimes, no symptoms.  Fever.  Headache.  Muscle aches.  Tingling.  Itching.  Tenderness.  Genital burning feeling.  Genital pain.  Pain with urination.  Pain with sexual intercourse.  Small blisters in the affected areas. RISK FACTORS   Kissing an infected person.  Sharing eating utensils with an infected person.  Unprotected sexual activity.  Multiple sexual partners.  Direct contact sports without protective clothing.  Contact with an exposed herpes sore.  Stress, illness, and cold increase the risk of recurrence. PROGNOSIS  The primary outbreak of an HSV infection usually lasts 2 to 3 weeks. However, it has been known to last up to 6 weeks. After the primary outbreak subsides, the virus goes into a stage known as latency. During this time, there may be no physical symptoms of infection. After a period of time, some event, such as stress, cold, or illness will trigger another outbreak. This cycle of latency and outbreak may continue indefinitely. The outbreaks usually become milder over time. The body cannot rid itself of HSV. RELATED COMPLICATIONS   Recurrence.  Infection in other areas of the body, such as the eye (ocular herpetic infection, keratitis) and rarely the brain (herpetic encephalitis). TREATMENT  Many HSV infections can be treated  without medicine. During an outbreak, avoid touching the sores. Ice may be used to dull the pain and suppress the virus. Exposure to the sun is a common trigger for an outbreak, so the use of sunscreen may help in such cases. Avoid sexual contact during outbreaks. During the latent periods, it is advised that you use latex condoms, which will reduce the likelihood of spreading the virus to another person. Condoms made from animal products do not protect against HSV. Female condoms cover a larger area than female condoms, and may offer the most protection from the transmission of HSV. The presence of HSV will not affect a condom's ability to protect against pregnancy. Only take medicines for pain and discomfort if directed to do so by your caregiver. Many claims exist that certain dietary changes will prevent an outbreak, but these claims have not been proven. These claims include eating foods that are high in L-lysine and low in arginine (i.e. yogurt, beets, apples, pears, mangoes, oily fish (such as salmon, haddock, snapper, and swordfish), soybean sprouts, chicken, and tomatoes).  Athletes may return to play once they are showing no symptoms, and they have been treated.  Document Released: 04/09/2005 Document Revised: 07/02/2011 Document Reviewed: 07/22/2008 [facility] Patient Information 2014 [place], [place].   Call me with dosage info, and I will let you know how to take.  My office is [facility] [phone] if needed for your RA, happy to see you.

## 2013-06-14 NOTE — ED Notes (Signed)
Pt  Has  Had  Shingles  In past   -  She  Reports        A  Rash  On  Back    X  1  Week  - it  [employer]    In  Sensation

## 2013-06-15 NOTE — ED Provider Notes (Signed)
Medical screening examination/treatment/procedure(s) were performed by a resident physician or non-physician practitioner and as the supervising physician I was immediately available for consultation/collaboration.  Clementeen Graham, MD    Rodolph Bong, MD 06/15/13 2016

## 2013-09-14 ENCOUNTER — Encounter (HOSPITAL_COMMUNITY): Payer: Self-pay | Admitting: Emergency Medicine

## 2013-09-14 ENCOUNTER — Emergency Department (HOSPITAL_COMMUNITY)
Admission: EM | Admit: 2013-09-14 | Discharge: 2013-09-14 | Disposition: A | Discharge status: Home / Self Care | Payer: Managed Care, Other (non HMO) | Attending: Emergency Medicine | Admitting: Emergency Medicine

## 2013-09-14 DIAGNOSIS — F17200 Nicotine dependence, unspecified, uncomplicated: Secondary | ICD-10-CM | POA: Insufficient documentation

## 2013-09-14 DIAGNOSIS — R42 Dizziness and giddiness: Secondary | ICD-10-CM | POA: Insufficient documentation

## 2013-09-14 DIAGNOSIS — J45909 Unspecified asthma, uncomplicated: Secondary | ICD-10-CM | POA: Insufficient documentation

## 2013-09-14 DIAGNOSIS — N939 Abnormal uterine and vaginal bleeding, unspecified: Secondary | ICD-10-CM

## 2013-09-14 DIAGNOSIS — N949 Unspecified condition associated with female genital organs and menstrual cycle: Secondary | ICD-10-CM | POA: Insufficient documentation

## 2013-09-14 DIAGNOSIS — N925 Other specified irregular menstruation: Secondary | ICD-10-CM | POA: Insufficient documentation

## 2013-09-14 DIAGNOSIS — Z8739 Personal history of other diseases of the musculoskeletal system and connective tissue: Secondary | ICD-10-CM | POA: Insufficient documentation

## 2013-09-14 DIAGNOSIS — Z3202 Encounter for pregnancy test, result negative: Secondary | ICD-10-CM | POA: Insufficient documentation

## 2013-09-14 DIAGNOSIS — Z8679 Personal history of other diseases of the circulatory system: Secondary | ICD-10-CM | POA: Insufficient documentation

## 2013-09-14 DIAGNOSIS — Z87898 Personal history of other specified conditions: Secondary | ICD-10-CM

## 2013-09-14 DIAGNOSIS — N938 Other specified abnormal uterine and vaginal bleeding: Secondary | ICD-10-CM | POA: Insufficient documentation

## 2013-09-14 DIAGNOSIS — Z8619 Personal history of other infectious and parasitic diseases: Secondary | ICD-10-CM | POA: Insufficient documentation

## 2013-09-14 DIAGNOSIS — Z79899 Other long term (current) drug therapy: Secondary | ICD-10-CM | POA: Insufficient documentation

## 2013-09-14 LAB — URINALYSIS, ROUTINE W REFLEX MICROSCOPIC
Bilirubin Urine: NEGATIVE
GLUCOSE, UA: NEGATIVE mg/dL
KETONES UR: 15 mg/dL — AB
Nitrite: NEGATIVE
PROTEIN: 30 mg/dL — AB
Specific Gravity, Urine: 1.021 (ref 1.005–1.030)
Urobilinogen, UA: 0.2 mg/dL (ref 0.0–1.0)
pH: 6 (ref 5.0–8.0)

## 2013-09-14 LAB — PREGNANCY, URINE: Preg Test, Ur: NEGATIVE

## 2013-09-14 LAB — BASIC METABOLIC PANEL
BUN: 10 mg/dL (ref 6–23)
CALCIUM: 8.5 mg/dL (ref 8.4–10.5)
CO2: 22 mEq/L (ref 19–32)
Chloride: 108 mEq/L (ref 96–112)
Creatinine, Ser: 0.63 mg/dL (ref 0.50–1.10)
GFR calc Af Amer: >90 mL/min (ref >90)
Glucose, Bld: 84 mg/dL (ref 70–99)
Potassium: 4.3 mEq/L (ref 3.7–5.3)
SODIUM: 141 meq/L (ref 137–147)

## 2013-09-14 LAB — URINE MICROSCOPIC-ADD ON

## 2013-09-14 LAB — CBC
HCT: 39.4 % (ref 36.0–46.0)
Hemoglobin: 13.8 g/dL (ref 12.0–15.0)
MCH: 30.9 pg (ref 26.0–34.0)
MCHC: 35 g/dL (ref 30.0–36.0)
MCV: 88.1 fL (ref 78.0–100.0)
PLATELETS: 188 10*3/uL (ref 150–400)
RBC: 4.47 MIL/uL (ref 3.87–5.11)
RDW: 12.7 % (ref 11.5–15.5)
WBC: 8.4 10*3/uL (ref 4.0–10.5)

## 2013-09-14 LAB — WET PREP, GENITAL
Trich, Wet Prep: NONE SEEN
YEAST WET PREP: NONE SEEN

## 2013-09-14 LAB — RPR

## 2013-09-14 MED ORDER — SODIUM CHLORIDE 0.9 % IV BOLUS (SEPSIS)
1000.0000 mL | Freq: Once | INTRAVENOUS | Status: AC
Start: 2013-09-14 — End: 2013-09-14
  Administered 2013-09-14: 1000 mL via INTRAVENOUS

## 2013-09-14 NOTE — ED Notes (Signed)
She states "i woke in a puddle of blood and it feels like my uterus is trying to crawl out of me." ALso she is having a migraine headache.

## 2013-09-14 NOTE — ED Provider Notes (Signed)
CSN: [id number]     Arrival date & time 09/14/13  1023 History   First MD Initiated Contact with Patient 09/14/13 1058     Chief Complaint  Patient presents with  . Vaginal Bleeding     (Consider location/radiation/quality/duration/timing/severity/associated sxs/prior Treatment) HPI Comments: Patient states she woke this morning in a public blood.  Since that time.  She is passing blood clot, as well as bright red blood.  She's changed her sanitary napkin approximately once per hour.  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  Due to a  twisted uterus.  She's been using condoms as a form of birth control. She also states, that 2 days, ago, she fainted after changing positions, that she's been feeling dizzy ever since.  She thinks the dizziness may be coming from her previous back surgery in 2009.  She had a scoliosis repair and when she turns her head in certain positions.  She feels discomfort in her head.  This is been going on for the past 4 years.  She denies any nausea or vomiting.  At this time  Patient is a 23 y.o. female presenting with vaginal bleeding. The history is provided by the patient.  Vaginal Bleeding Quality:  Bright red and clots Severity:  Moderate Onset quality:  Sudden Duration:  3 hours Timing:  Constant Progression:  Unchanged Chronicity:  Recurrent Menstrual history:  Irregular Number of pads used:  3 Number of tampons used:  0 Possible pregnancy: no   Context: spontaneously   Relieved by:  None tried Worsened by:  Nothing tried Ineffective treatments:  None tried Associated symptoms: dizziness   Associated symptoms: no dysuria, no fever, no nausea and no vaginal discharge   Risk factors: ovarian torsion   Risk factors: no new sexual partner, no ovarian cysts, no prior miscarriage, no STD, no STD exposure, no terminated pregnancies and does not have unprotected sex     Past Medical History  Diagnosis  Date  . JRA (juvenile rheumatoid arthritis)   . Scoliosis   . Chlamydia 03/2011, 10/2011  . Migraine   . Asthma   . HSV (herpes simplex virus) anogenital infection 10 after her/'2014   Past Surgical History  Procedure Laterality Date  . Scoliosis surgery  2009   Family History  Problem Relation Age of Onset  . Cancer Other     unsure    History  Substance Use Topics  . Smoking status: Current Every Day Smoker -- 0.50 packs/day    Types: Cigarettes  . Smokeless tobacco: Never Used     Comment: two cigarettes a day  . Alcohol Use: No   OB History   Grav Para Term Preterm Abortions TAB SAB Ect Mult Living   0              Review of Systems  Constitutional: Negative for fever and chills.  HENT: Positive for congestion. Negative for ear pain and sore throat.   Cardiovascular: Negative for chest pain.  Gastrointestinal: Negative for nausea, vomiting and diarrhea.  Genitourinary: Positive for vaginal bleeding. Negative for dysuria and vaginal discharge.  Neurological: Positive for dizziness. Negative for numbness.  All other systems reviewed and are negative.     Allergies  Review of patient's allergies indicates no known allergies.  Home Medications   Prior to Admission medications   Medication Sig Start Date End Date Taking? Authorizing Provider  albuterol (PROVENTIL HFA;VENTOLIN HFA) 108 (90 BASE) MCG/ACT  inhaler Inhale 1 puff into the lungs every 6 (six) hours as needed. For shortness of breath   Yes Historical Provider, MD  Pseudoeph-Doxylamine-DM-APAP (NYQUIL PO) Take 2 capsules by mouth at bedtime as needed (cold symptoms).   Yes Historical Provider, MD  valACYclovir (VALTREX) 500 MG tablet Take 500 mg by mouth daily as needed (breakout).   Yes Historical Provider, MD   BP 107/67  Pulse 77  Temp(Src) 98.1 F (36.7 C) (Oral)  Resp 14  SpO2 100%  LMP 08/31/2013 Physical Exam  Nursing note and vitals reviewed. Constitutional: She appears well-developed and  well-nourished.  HENT:  Head: Normocephalic.  Right Ear: External ear normal.  Left Ear: External ear normal.  Mouth/Throat: Oropharynx is clear and moist.  Eyes: Pupils are equal, round, and reactive to light.  Neck: Normal range of motion.  Cardiovascular: Normal rate and regular rhythm.   Pulmonary/Chest: Effort normal and breath sounds normal.  Abdominal: Soft. She exhibits no distension. There is no tenderness.  Genitourinary: Guaiac negative stool. Uterus is not enlarged and not tender. Cervix exhibits no discharge and no friability. Right adnexum displays tenderness. Right adnexum displays no fullness. Left adnexum displays tenderness. Left adnexum displays no fullness. No vaginal discharge found.  Small amount of blood in the vaginal vault.  Cervical os is closed.  There is no pouring of blood from the os  Lymphadenopathy:    She has no cervical adenopathy.    ED Course  Procedures (including critical care time) Labs Review Labs Reviewed  WET PREP, GENITAL - Abnormal; Notable for the following:    Clue Cells Wet Prep HPF POC FEW (*)    WBC, Wet Prep HPF POC FEW (*)    All other components within normal limits  URINALYSIS, ROUTINE W REFLEX MICROSCOPIC - Abnormal; Notable for the following:    Color, Urine AMBER (*)    APPearance CLOUDY (*)    Hgb urine dipstick LARGE (*)    Ketones, ur 15 (*)    Protein, ur 30 (*)    Leukocytes, UA MODERATE (*)    All other components within normal limits  URINE MICROSCOPIC-ADD ON - Abnormal; Notable for the following:    Squamous Epithelial / LPF FEW (*)    Bacteria, UA MANY (*)    All other components within normal limits  GC/CHLAMYDIA PROBE AMP  URINE CULTURE  CBC  BASIC METABOLIC PANEL  PREGNANCY, URINE  RPR  HIV ANTIBODY (ROUTINE TESTING)    Imaging Review No results found.   EKG Interpretation None      MDM  Patient has minimal bleeding.  On examination, she does not have tenderness.  On examination, more profuse  vaginal discharge.  Urine shows some bacteria, but is a clean catch urine.  This has been, cultured.  At this time.  She does not appear to have a urinary tract symptoms.  She had a near syncopal or syncopal event 2 days ago.  At this time.  She is not orthostatic more anemic and is safe for this patient to be discharged home.  Follow up with her, OB/GYN.  She's also been given a resource list to help her find a primary care physician Final diagnoses:  Abnormal vaginal bleeding  H/O syncope         Arman Filter, NP 09/14/13 1339

## 2013-09-14 NOTE — Discharge Instructions (Signed)
Abnormal Uterine Bleeding Abnormal uterine bleeding means bleeding from the vagina that is not your normal menstrual period. This can be:  Bleeding or spotting between periods.  Bleeding after sex (sexual intercourse).  Bleeding that is heavier or more than normal.  Periods that last longer than usual.  Bleeding after menopause. There are many problems that may cause this. Treatment will depend on the cause of the bleeding. Any kind of bleeding that is not normal should be reviewed by your doctor.  HOME CARE Watch your condition for any changes. These actions may lessen any discomfort you are having:  Do not use tampons or douches as told by your doctor.  Change your pads often. You should get regular pelvic exams and Pap tests. Keep all appointments for tests as told by your doctor. GET HELP IF:  You are bleeding for more than 1 week.  You feel dizzy at times. GET HELP RIGHT AWAY IF:   You pass out.  You have to change pads every 15 to 30 minutes.  You have belly pain.  You have a fever.  You become sweaty or weak.  You are passing large blood clots from the vagina.  You feel sick to your stomach (nauseous) and throw up (vomit). MAKE SURE YOU:  Understand these instructions.  Will watch your condition.  Will get help right away if you are not doing well or get worse. Document Released: 02/04/2009 Document Revised: 01/28/2013 Document Reviewed: 11/06/2012 [facility] Patient Information 2014 [place], [place]. Your do not appear to have an infection, your are not pregnant nor anemic Please follow up with your OB/GYN for further care of your abnormal bleeding    Emergency Department Resource Guide 1) Find a Doctor and Pay Out of Pocket Although you won't have to find out who is covered by your insurance plan, it is a good idea to ask around and get recommendations. You will then need to call the office and see if the doctor you have chosen will accept you as a new  patient and what types of options they offer for patients who are self-pay. Some doctors offer discounts or will set up payment plans for their patients who do not have insurance, but you will need to ask so you aren't surprised when you get to your appointment.  2) Contact Your Local Health Department Not all health departments have doctors that can see patients for sick visits, but many do, so it is worth a call to see if yours does. If you don't know where your local health department is, you can check in your phone book. The CDC also has a tool to help you locate your state's health department, and many state websites also have listings of all of their local health departments.  3) Find a Walk-in Clinic If your illness is not likely to be very severe or complicated, you may want to try a walk in clinic. These are popping up all over the country in pharmacies, drugstores, and shopping centers. They're usually staffed by nurse practitioners or physician assistants that have been trained to treat common illnesses and complaints. They're usually fairly quick and inexpensive. However, if you have serious medical issues or chronic medical problems, these are probably not your best option.  No Primary Care Doctor: - Call Health Connect at  [phone] - they can help you locate a primary care doctor that  accepts your insurance, provides certain services, etc. - Physician Referral Service- [phone]  Chronic Pain Problems: Organization  Address  Phone   Notes  [facility]  [phone] Patients need to be referred by their primary care doctor.   Medication Assistance: Organization         Address  Phone   Notes  [facility] Medication [facility] [place] [place], [place] [address] [phone] --Must be a resident of [facility] -- Must have NO insurance coverage whatsoever (no Medicaid/ Medicare, etc.) -- The pt. MUST have a primary  care doctor that directs their care regularly and follows them in the community   MedAssist  [phone]   [employer]  [phone]    Agencies that provide inexpensive medical care: Organization         Address  Phone   Notes  [place]  [phone]   [facility]    [phone]   [facility] [place], [place] [address] [phone]   [place] [address] [place] [phone]   Planned Parenthood    [phone]   [facility]    [phone]   [place] and [address], [place] Phone:  [phone], Fax:  [phone] Hours of Operation:  9 am - 6 pm, M-F.  Also accepts Medicaid/Medicare and self-pay.  [facility] for [place], [address], [place] Phone: [phone], Fax: [phone]. Hours of Operation:  8:30 am - 5:30 pm, M-F.  Also accepts Medicaid and self-pay.  [facility] [place] [address] [place] Phone: [phone]   [place], [place], [place] [phone], Ext. [phone] Mondays & Thursdays: 7-9 AM.  First 15 patients are seen on a first come, first serve basis.    [place] Providers:  Organization         Address  Phone   Notes  [facility] [address], [place] [phone] Also accepts self-pay patients.  [facility] [address], [place]  [phone]   [place], [address], [place] [phone]   [facility] [address] [place] [phone]   Lucianne Lei [address], [place]   [phone] Only accepts [place] Access [place] patients after they have their name applied to their card.   Self-Pay (no insurance) in [facility]:  Organization         Address  Phone   Notes  Sickle Cell Patients, [facility] [place] [phone]   [facility] [place] [phone]   [facility] [place]  [place], [place], [place] [phone]   [facility]/Dr. Osei-Bonsu  [address] [place] or [address], [place] [phone] Phone number for both [place] and [place] locations is the same.  Urgent Medical and [facility] [address], [place] [phone]   [facility] [address] [place] or [address] [phone]   [facility] [address] [place] [phone], phone; [phone], fax Sees patients 1st and 3rd Saturday of every month.  Must not qualify  for public or private insurance (i.e. Medicaid, Medicare, [place] Health Choice, Veterans' Benefits)  Household income should be no more than 200% of the poverty level The clinic cannot treat you if you are pregnant or think you are pregnant  Sexually transmitted diseases are not treated at the clinic.    Dental Care: Organization         Address  Phone  Notes  [facility] Department of [facility] [phone] Accepts children up to age 28 who are enrolled in [place] or [place]; pregnant women with a Medicaid card; and children who have applied for Medicaid or [place] Health Choice, but were declined, whose parents can pay a reduced fee at time of service.  [facility] Department of [facility]  [address], [place] [phone] Accepts children up to age 68 who are enrolled in [place] or [place]; pregnant women with a Medicaid card; and children who have applied for Medicaid or  Health Choice, but were declined, whose parents can  pay a reduced fee at time of service.  [facility]  [place] [phone] Patients are seen by appointment only. Walk-ins are not accepted. [place] will see patients 52 years of age and older. Monday - Tuesday (8am-5pm) Most Wednesdays (8:30-5pm) $30 per visit, cash only  [facility] Adult Dental Access PROGRAM  [address], [facility] [phone] Patients are seen by appointment only. Walk-ins are not accepted. [place] will see patients 36 years of age and older. One Wednesday Evening (Monthly: Volunteer Based).  $30 per visit, cash only  [place]  [phone] for adults; Children under age 40, call Graduate Pediatric Dentistry at [phone]. Children aged 22-14, please call [phone] to request a pediatric application.  Dental services are provided in all areas of dental care including fillings, crowns and bridges, complete and partial dentures, implants, gum treatment, root canals, and extractions. Preventive care is also provided. Treatment is provided to both adults and children. Patients are selected via a lottery and there is often a waiting list.   [facility] [address] [place]  [phone] [email or web address]   [facility] [address] [place], [place] [phone], Ext. [phone] Second and Fourth Thursday of each month, opens at 6:30 AM; Clinic ends at 9 AM.  Patients are seen on a first-come first-served basis, and a limited number are seen during each clinic.   [facility]  [address] Hillard Danker [place], [place] [phone]   Eligibility Requirements You must have lived in [place], [place], or [place] counties for at least the last three months.   You cannot be eligible for state or federal sponsored [employer], including [employer], [place], or [employer].   You generally cannot be eligible for healthcare  insurance through your employer.    How to apply: Eligibility screenings are held every Tuesday and Wednesday afternoon from 1:00 pm until 4:00 pm. You do not need an appointment for the interview!  [facility] [address] [place], [place]   [place]  [place] Department  [place]  [phone]    Behavioral Health Resources in the Community: Intensive Outpatient Programs Organization         Address  Phone  Notes  [facility] [address] [place], [place] [phone]   [facility] Outpatient [address], [place], [place] [phone]   [facility]: [facility] Drug [facility] [address], [place], [place]  [phone]   [facility] Mental Health [address],  [place], [place] [phone] or [phone]   Substance Abuse Resources Organization         Address  Phone  Notes  Alcohol and Drug Services  [phone]   [facility]  [phone]   [facility] [place]  [phone]   Floydene Flock  [phone]   Residential & Outpatient Substance Abuse Program  [phone]   Psychological Services Organization         Address  Phone  Notes  [facility]  [phone]   [facility] Services  [phone]   [facility] Mental Health [address], [place] [phone] or [phone]    Mobile Crisis Teams Organization         Address  Phone  Notes  [facility], Mobile Crisis Care Unit  [phone]   [facility]  [address] [place], [place] [phone]   Doristine Locks [address] [place] [phone]    Self-Help/Support Groups Organization         Address  Phone             Notes  [facility] [place] - variety of support groups  336- I7437963 Call for more information  Narcotics Anonymous (NA),  [facility] [address], [employer] [place]  2 meetings at this location   Statistician         Address  Phone  Notes  [facility] [address],    [place]  [phone]   [facility]  [address] [place] [address], [place], [place] [phone]   [facility] [address] [place], [place] [phone] Admissions: 8am-3pm M-F  [facility] [address]    [place], [place] [phone]   [facility] [address] [place], [place], [place] [phone]   [facility] [address]  [place], [place] [phone]   [facility] - Intensive Outpatient [address], [place], [place] [phone]   [facility] (Addiction Recovery [facility] [address] [place].,  [place], [place] [phone] or [phone]   [facility] (RT[facility]) [address], [place], [place] [phone] Accepts Medicaid  [facility] [place] [address],  [place] [phone] Substance Abuse/Addiction Treatment   [facility] Organization         Address  Phone  Notes  [facility]  [phone]   Angie Fava, PhD [address] [place] [address] Ervin Knack [place], [place]   [phone] or ([phone]   [facility] Behavioral   [address] [place], [place] [phone]   [facility] [address] [place], [place] [phone] Insurance/Medicaid/sponsorship through [facility] and Families [address]                                    [place], [place] [phone] Therapy/tele-psych/case  [facility] [address], [place] [phone]    Dr. Lolly Mustache  [phone]   [facility] [place]  [facility]. 1) [address] [place] 2) [address] [place] 3)  [address], [place] [phone]  [phone]    [facility] Child Abuse Hotline 539-107-8176 or [phone] (After Hours)

## 2013-09-14 NOTE — ED Provider Notes (Signed)
Medical screening examination/treatment/procedure(s) were performed by non-physician practitioner and as supervising physician I was immediately available for consultation/collaboration.   EKG Interpretation None       Doug Sou, MD 09/14/13 1710

## 2013-09-15 LAB — HIV ANTIBODY (ROUTINE TESTING W REFLEX): HIV 1&2 Ab, 4th Generation: NONREACTIVE

## 2013-09-15 LAB — GC/CHLAMYDIA PROBE AMP
CT Probe RNA: POSITIVE — AB
GC Probe RNA: NEGATIVE

## 2013-09-16 ENCOUNTER — Telehealth (HOSPITAL_BASED_OUTPATIENT_CLINIC_OR_DEPARTMENT_OTHER): Payer: Self-pay | Admitting: Emergency Medicine

## 2013-09-16 LAB — URINE CULTURE: SPECIAL REQUESTS: NORMAL

## 2013-09-16 NOTE — Telephone Encounter (Signed)
+  Chlamydia. Chart sent to EDP office for review. DHHS attached. 

## 2013-09-17 NOTE — Telephone Encounter (Signed)
Post ED Visit - Positive Culture Follow-up  Culture report reviewed by antimicrobial stewardship pharmacist: []  Wes Dulaney, Pharm.D., BCPS []  , Pharm.D., BCPS []  , Celedonio Miyamoto.D., BCPS []  [place], Georgina Pillion.D., BCPS, AAHIVP []  [address], Pharm.D., BCPS, AAHIVP []  , Pharm.D. [x]  [place], Pharm.D.  Positive urine culture Per Marissa Sciacca PA-C, no treatment needed and no further patient follow-up is required at this time.  Hjalmer Iovino 09/17/2013, 11:40 AM

## 2013-09-22 NOTE — Telephone Encounter (Signed)
Rx for Zithromax 250 mg (#4) take 1 gram PO once, prescribed by Jaynie Crumble PA-C, called to [facility] on [address] [phone]) by Norm Parcel PFM.

## 2013-10-29 ENCOUNTER — Telehealth: Payer: Self-pay

## 2013-10-29 MED ORDER — VALACYCLOVIR HCL 500 MG PO TABS: 500.0000 mg | ORAL_TABLET | Freq: Every day | ORAL | Status: DC

## 2013-10-29 NOTE — Telephone Encounter (Signed)
Spoke with patient. She was reminded that she is overdue for her annual exam and needs to schedule as soon as she returns. Refill sent.

## 2013-10-29 NOTE — Telephone Encounter (Signed)
[place] Valtrex.

## 2014-04-24 ENCOUNTER — Emergency Department (INDEPENDENT_AMBULATORY_CARE_PROVIDER_SITE_OTHER)
Admission: EM | Admit: 2014-04-24 | Discharge: 2014-04-24 | Disposition: A | Discharge status: Home / Self Care | Payer: Managed Care, Other (non HMO) | Source: Home / Self Care | Attending: Family Medicine | Admitting: Family Medicine

## 2014-04-24 ENCOUNTER — Encounter (HOSPITAL_COMMUNITY): Payer: Self-pay | Admitting: Emergency Medicine

## 2014-04-24 DIAGNOSIS — N939 Abnormal uterine and vaginal bleeding, unspecified: Secondary | ICD-10-CM

## 2014-04-24 DIAGNOSIS — R55 Syncope and collapse: Secondary | ICD-10-CM

## 2014-04-24 LAB — POCT URINALYSIS DIP (DEVICE)
Bilirubin Urine: NEGATIVE
Glucose, UA: NEGATIVE mg/dL
Hgb urine dipstick: NEGATIVE
Ketones, ur: NEGATIVE mg/dL
NITRITE: POSITIVE — AB
PH: 6 (ref 5.0–8.0)
Protein, ur: NEGATIVE mg/dL
Specific Gravity, Urine: 1.02 (ref 1.005–1.030)
UROBILINOGEN UA: 0.2 mg/dL (ref 0.0–1.0)

## 2014-04-24 LAB — POCT I-STAT, CHEM 8
BUN: 10 mg/dL (ref 6–23)
CALCIUM ION: 1.18 mmol/L (ref 1.12–1.23)
Chloride: 106 mEq/L (ref 96–112)
Creatinine, Ser: 0.6 mg/dL (ref 0.50–1.10)
Glucose, Bld: 77 mg/dL (ref 70–99)
HCT: 47 % — ABNORMAL HIGH (ref 36.0–46.0)
HEMOGLOBIN: 16 g/dL — AB (ref 12.0–15.0)
Potassium: 3.6 mmol/L (ref 3.5–5.1)
Sodium: 142 mmol/L (ref 135–145)
TCO2: 22 mmol/L (ref 0–100)

## 2014-04-24 LAB — POCT PREGNANCY, URINE: Preg Test, Ur: NEGATIVE

## 2014-04-24 MED ORDER — NORGESTIMATE-ETH ESTRADIOL 0.25-35 MG-MCG PO TABS: 1.0000 | ORAL_TABLET | Freq: Every day | ORAL | Status: DC

## 2014-04-24 NOTE — Discharge Instructions (Signed)
Thank you for coming in today. Start taking birth control pills for vaginal bleeding Follow-up with OB/GYN for this issue  Follow-up with cardiology for passing out Call or go to the emergency room if you get worse, have trouble breathing, have chest pains, or palpitations.   Syncope Syncope is a medical term for fainting or passing out. This means you lose consciousness and drop to the ground. People are generally unconscious for less than 5 minutes. You may have some muscle twitches for up to 15 seconds before waking up and returning to normal. Syncope occurs more often in older adults, but it can happen to anyone. While most causes of syncope are not dangerous, syncope can be a sign of a serious medical problem. It is important to seek medical care.  CAUSES  Syncope is caused by a sudden drop in blood flow to the brain. The specific cause is often not determined. Factors that can bring on syncope include:  Taking medicines that lower blood pressure.  Sudden changes in posture, such as standing up quickly.  Taking more medicine than prescribed.  Standing in one place for too long.  Seizure disorders.  Dehydration and excessive exposure to heat.  Low blood sugar (hypoglycemia).  Straining to have a bowel movement.  Heart disease, irregular heartbeat, or other circulatory problems.  Fear, emotional distress, seeing blood, or severe pain. SYMPTOMS  Right before fainting, you may:  Feel dizzy or light-headed.  Feel nauseous.  See all white or all black in your field of vision.  Have cold, clammy skin. DIAGNOSIS  Your health care provider will ask about your symptoms, perform a physical exam, and perform an electrocardiogram (ECG) to record the electrical activity of your heart. Your health care provider may also perform other heart or blood tests to determine the cause of your syncope which may include:  Transthoracic echocardiogram (TTE). During echocardiography, sound  waves are used to evaluate how blood flows through your heart.  Transesophageal echocardiogram (TEE).  Cardiac monitoring. This allows your health care provider to monitor your heart rate and rhythm in real time.  Holter monitor. This is a portable device that records your heartbeat and can help diagnose heart arrhythmias. It allows your health care provider to track your heart activity for several days, if needed.  Stress tests by exercise or by giving medicine that makes the heart beat faster. TREATMENT  In most cases, no treatment is needed. Depending on the cause of your syncope, your health care provider may recommend changing or stopping some of your medicines. HOME CARE INSTRUCTIONS  Have someone stay with you until you feel stable.  Do not drive, use machinery, or play sports until your health care provider says it is okay.  Keep all follow-up appointments as directed by your health care provider.  Lie down right away if you start feeling like you might faint. Breathe deeply and steadily. Wait until all the symptoms have passed.  Drink enough fluids to keep your urine clear or pale yellow.  If you are taking blood pressure or heart medicine, get up slowly and take several minutes to sit and then stand. This can reduce dizziness. SEEK IMMEDIATE MEDICAL CARE IF:   You have a severe headache.  You have unusual pain in the chest, abdomen, or back.  You are bleeding from your mouth or rectum, or you have black or tarry stool.  You have an irregular or very fast heartbeat.  You have pain with breathing.  You have repeated  fainting or seizure-like jerking during an episode.  You faint when sitting or lying down.  You have confusion.  You have trouble walking.  You have severe weakness.  You have vision problems. If you fainted, call your local emergency services (911 in [place] Do not drive yourself to the hospital.  MAKE SURE YOU:  Understand these  instructions.  Will watch your condition.  Will get help right away if you are not doing well or get worse. Document Released: 04/09/2005 Document Revised: 04/14/2013 Document Reviewed: 06/08/2011 [facility] Patient Information 2015 [place], [place]. This information is not intended to replace advice given to you by your health care provider. Make sure you discuss any questions you have with your health care provider.   [facility] [address] [place], [place] [phone]  [facility] [address] [place], [place] [phone]  [facility]: Silverio Lay MD [address] [place], [place] [phone]  [facility] [address] [place], [place] [phone]  [facility]: Marcelle Overlie MD [address] [place], [place] [phone]  [facility]: Tracey Harries F MD [address] [place], [place] [phone]  [facility] [address] [place], [place] [phone]   [facility]: [address], [place], [place] [address] [phone]

## 2014-04-24 NOTE — ED Provider Notes (Signed)
Diane Glass is a 24 y.o. female who presents to Urgent Care today for syncope. Patient awoke this morning with vaginal bleeding and stood up and suffered a syncopal event. She denies any chest pain or palpitations. She's had this multiple times prior but has never had it evaluated fully. She feels well and normally now. She additionally has a several year history of heavy vaginal bleeding. This is been not thoroughly evaluated yet. No vomiting or diarrhea. She is not currently bleeding. No pelvic pain or pressure.   Past Medical History  Diagnosis Date  . JRA (juvenile rheumatoid arthritis)   . Scoliosis   . Chlamydia 03/2011, 10/2011  . Migraine   . Asthma   . HSV (herpes simplex virus) anogenital infection 10 after her/'2014   Past Surgical History  Procedure Laterality Date  . Scoliosis surgery  2009   History  Substance Use Topics  . Smoking status: Current Every Day Smoker -- 0.50 packs/day    Types: Cigarettes  . Smokeless tobacco: Never Used     Comment: two cigarettes a day  . Alcohol Use: No   ROS as above Medications: No current facility-administered medications for this encounter.   Current Outpatient Prescriptions  Medication Sig Dispense Refill  . albuterol (PROVENTIL HFA;VENTOLIN HFA) 108 (90 BASE) MCG/ACT inhaler Inhale 1 puff into the lungs every 6 (six) hours as needed. For shortness of breath    . Pseudoeph-Doxylamine-DM-APAP (NYQUIL PO) Take 2 capsules by mouth at bedtime as needed (cold symptoms).    . valACYclovir (VALTREX) 500 MG tablet Take 1 tablet (500 mg total) by mouth daily. 90 tablet 0   No Known Allergies   Exam:  BP 106/69 mmHg  Pulse 76  Temp(Src) 98 F (36.7 C) (Oral)  Resp 16  SpO2 98%  LMP 04/24/2014   Orthostatic VS for the past 24 hrs:  BP- Lying Pulse- Lying BP- Sitting Pulse- Sitting BP- Standing at 0 minutes Pulse- Standing at 0 minutes  04/24/14 1653 101/62 mmHg 73 110/79 mmHg 59 101/70 mmHg 59    Gen: Well NAD nontoxic  appearing HEENT: EOMI,  MMM Lungs: Normal work of breathing. CTABL Heart: RRR no MRG Abd: NABS, Soft. Nondistended, Nontender Exts: Brisk capillary refill, warm and well perfused.    12-lead EKG normal sinus rhythm at 65 bpm. No ST segment elevation or depression. Normal EKG. QTC 393  Results for orders placed or performed during the hospital encounter of 04/24/14 (from the past 24 hour(s))  POCT urinalysis dip (device)     Status: Abnormal   Collection Time: 04/24/14  4:26 PM  Result Value Ref Range   Glucose, UA NEGATIVE NEGATIVE mg/dL   Bilirubin Urine NEGATIVE NEGATIVE   Ketones, ur NEGATIVE NEGATIVE mg/dL   Specific Gravity, Urine 1.020 1.005 - 1.030   Hgb urine dipstick NEGATIVE NEGATIVE   pH 6.0 5.0 - 8.0   Protein, ur NEGATIVE NEGATIVE mg/dL   Urobilinogen, UA 0.2 0.0 - 1.0 mg/dL   Nitrite POSITIVE (A) NEGATIVE   Leukocytes, UA TRACE (A) NEGATIVE  I-STAT, chem 8     Status: Abnormal   Collection Time: 04/24/14  4:36 PM  Result Value Ref Range   Sodium 142 135 - 145 mmol/L   Potassium 3.6 3.5 - 5.1 mmol/L   Chloride 106 96 - 112 mEq/L   BUN 10 6 - 23 mg/dL   Creatinine, Ser 1.61 0.50 - 1.10 mg/dL   Glucose, Bld 77 70 - 99 mg/dL   Calcium, Ion 0.96 0.45 -  1.23 mmol/L   TCO2 22 0 - 100 mmol/L   Hemoglobin 16.0 (H) 12.0 - 15.0 g/dL   HCT 97.0 (H) 26.3 - 78.5 %  Pregnancy, urine POC     Status: None   Collection Time: 04/24/14  4:36 PM  Result Value Ref Range   Preg Test, Ur NEGATIVE NEGATIVE   No results found.  Assessment and Plan: 24 y.o. female with syncope. Likely orthostatic or vasovagal. Doubtful patient is significantly anemic with hemoglobin of 16 on i-STAT Chem-8. This patient has never had her syncope evaluated for refer to cardiology for evaluation of this issue area additionally recommend patient follow-up with OB/GYN for vaginal bleeding. We'll prescribe one month of Sprintec for this issue.  Discussed warning signs or symptoms. Please see discharge  instructions. Patient expresses understanding.     Rodolph Bong, MD 04/24/14 [phone]

## 2014-04-24 NOTE — ED Notes (Signed)
C/o abn vag bleeding onset 1 month Going through 8-9 tampons per day Sx also include pelvic cramps Has seen OBGYN for similar sx in the past hx w/Polyovarian Cyst  Alert, no signs of acute distress.

## 2014-06-01 ENCOUNTER — Institutional Professional Consult (permissible substitution): Payer: Managed Care, Other (non HMO) | Admitting: Interventional Cardiology

## 2014-06-02 ENCOUNTER — Institutional Professional Consult (permissible substitution): Payer: Managed Care, Other (non HMO) | Admitting: Interventional Cardiology

## 2014-06-02 ENCOUNTER — Ambulatory Visit (INDEPENDENT_AMBULATORY_CARE_PROVIDER_SITE_OTHER): Payer: Managed Care, Other (non HMO) | Admitting: Interventional Cardiology

## 2014-06-02 ENCOUNTER — Encounter: Payer: Self-pay | Admitting: Interventional Cardiology

## 2014-06-02 VITALS — BP 112/74 | HR 75 | Ht 62.0 in | Wt 141.0 lb

## 2014-06-02 DIAGNOSIS — R55 Syncope and collapse: Secondary | ICD-10-CM

## 2014-06-02 NOTE — Patient Instructions (Signed)
Your physician has requested that you have an echocardiogram. Echocardiography is a painless test that uses sound waves to create images of your heart. It provides your doctor with information about the size and shape of your heart and how well your heart's chambers and valves are working. This procedure takes approximately one hour. There are no restrictions for this procedure.   Your physician recommends that you schedule a follow-up appointment as needed with Dr. Varanasi.  

## 2014-06-02 NOTE — Progress Notes (Signed)
Patient ID: Diane Glass, female   DOB: 1990-11-07, 24 y.o.   MRN: [id number]     Patient ID: Diane Glass MRN: [id number] DOB/AGE: 03/17/1991 24 y.o.   Referring Physician : Dr. Colin Broach   Reason for Consultation: syncope  HPI: 24 y/o who has had recurrent syncope during menstrual periods.  She was hospitalized in 11/15 for passing out and hitting her head on a counter.  She was standing and then got really hot and felt that she needed to sit down.  She could not make it to a chair.  She does not recall having an echo.  She has not been told that she is anemic.  No recent chest pain.   No family h/o CAD.   No family h/o sudden death.    She thinks she passes out with every other menstrual cycle, particularly if it lasts longer than a week.  She has lightheadedness, particularly with standing up quickly.  She does not do any regular exercise.     No current outpatient prescriptions on file.   No current facility-administered medications for this visit.   Past Medical History  Diagnosis Date  . JRA (juvenile rheumatoid arthritis)   . Scoliosis   . Chlamydia 03/2011, 10/2011  . Migraine   . Asthma   . HSV (herpes simplex virus) anogenital infection 10 after her/'2014    Family History  Problem Relation Age of Onset  . Cancer Other     unsure     History   Social History  . Marital Status: Single    Spouse Name: N/A  . Number of Children: N/A  . Years of Education: N/A   Occupational History  . Not on file.   Social History Main Topics  . Smoking status: Current Every Day Smoker -- 0.50 packs/day    Types: Cigarettes  . Smokeless tobacco: Never Used     Comment: two cigarettes a day  . Alcohol Use: No  . Drug Use: No  . Sexual Activity: Yes    Birth Control/ Protection: Condom   Other Topics Concern  . Not on file   Social History Narrative    Past Surgical History  Procedure Laterality Date  . Scoliosis surgery  2009      (Not in a  hospital admission)  Review of systems complete and found to be negative unless listed above .  No nausea, vomiting.  No fever chills, No focal weakness,  No palpitations.  Physical Exam: Filed Vitals:   06/02/14 1541  BP: 112/74  Pulse: 75    Weight: 141 lb (63.957 kg)  Physical exam:  /AT EOMI No JVD, No carotid bruit RRR S1S2  No wheezing Soft. NT, nondistended No edema. No focal motor or sensory deficits Normal affect  Labs:   Lab Results  Component Value Date   WBC 8.4 09/14/2013   HGB 16.0* 04/24/2014   HCT 47.0* 04/24/2014   MCV 88.1 09/14/2013   PLT 188 09/14/2013   No results for input(s): NA, K, CL, CO2, BUN, CREATININE, CALCIUM, PROT, BILITOT, ALKPHOS, ALT, AST, GLUCOSE in the last 168 hours.  Invalid input(s): LABALBU No results found for: CKTOTAL, CKMB, CKMBINDEX, TROPONINI No results found for: CHOL No results found for: HDL No results found for: LDLCALC No results found for: TRIG No results found for: CHOLHDL No results found for: LDLDIRECT    Radiology: EKG: Normal  ASSESSMENT AND PLAN:  Syncope: Likely related to vasovagal syncope when she stands for prolonged periods of  time. Seems to also occur after prolonged menstrual cycles. I encouraged her to stay well hydrated during these situations. We'll check echocardiogram to evaluate for any structural heart disease that predisposes her to syncope.  Doubt any serious cardiac pathology based on exam and ECG.    She should followup with GYN regarding her prolonged menstrual bleeding.   Signed:   Fredric Mare, MD, [facility] 06/02/2014, 5:18 PM

## 2014-06-04 ENCOUNTER — Emergency Department (HOSPITAL_COMMUNITY)
Admission: EM | Admit: 2014-06-04 | Discharge: 2014-06-04 | Disposition: A | Discharge status: Home / Self Care | Payer: Managed Care, Other (non HMO) | Source: Home / Self Care | Attending: Emergency Medicine | Admitting: Emergency Medicine

## 2014-06-04 ENCOUNTER — Encounter (HOSPITAL_COMMUNITY): Payer: Self-pay | Admitting: *Deleted

## 2014-06-04 ENCOUNTER — Emergency Department (HOSPITAL_COMMUNITY): Payer: Managed Care, Other (non HMO)

## 2014-06-04 DIAGNOSIS — R102 Pelvic and perineal pain: Secondary | ICD-10-CM

## 2014-06-04 DIAGNOSIS — M0800 Unspecified juvenile rheumatoid arthritis of unspecified site: Secondary | ICD-10-CM | POA: Insufficient documentation

## 2014-06-04 DIAGNOSIS — N390 Urinary tract infection, site not specified: Secondary | ICD-10-CM

## 2014-06-04 DIAGNOSIS — Z720 Tobacco use: Secondary | ICD-10-CM | POA: Insufficient documentation

## 2014-06-04 DIAGNOSIS — R1030 Lower abdominal pain, unspecified: Secondary | ICD-10-CM | POA: Diagnosis present

## 2014-06-04 DIAGNOSIS — Z8619 Personal history of other infectious and parasitic diseases: Secondary | ICD-10-CM | POA: Diagnosis not present

## 2014-06-04 DIAGNOSIS — Z3202 Encounter for pregnancy test, result negative: Secondary | ICD-10-CM

## 2014-06-04 DIAGNOSIS — N8320 Unspecified ovarian cysts: Secondary | ICD-10-CM | POA: Diagnosis not present

## 2014-06-04 DIAGNOSIS — Z8679 Personal history of other diseases of the circulatory system: Secondary | ICD-10-CM | POA: Insufficient documentation

## 2014-06-04 DIAGNOSIS — J45909 Unspecified asthma, uncomplicated: Secondary | ICD-10-CM | POA: Insufficient documentation

## 2014-06-04 DIAGNOSIS — R109 Unspecified abdominal pain: Secondary | ICD-10-CM

## 2014-06-04 DIAGNOSIS — N83201 Unspecified ovarian cyst, right side: Secondary | ICD-10-CM

## 2014-06-04 LAB — GC/CHLAMYDIA PROBE AMP (~~LOC~~) NOT AT ARMC
CHLAMYDIA, DNA PROBE: NEGATIVE
NEISSERIA GONORRHEA: NEGATIVE

## 2014-06-04 LAB — URINALYSIS, ROUTINE W REFLEX MICROSCOPIC
Bilirubin Urine: NEGATIVE
Glucose, UA: NEGATIVE mg/dL
HGB URINE DIPSTICK: NEGATIVE
Ketones, ur: NEGATIVE mg/dL
Nitrite: POSITIVE — AB
PROTEIN: NEGATIVE mg/dL
SPECIFIC GRAVITY, URINE: 1.016 (ref 1.005–1.030)
Urobilinogen, UA: 0.2 mg/dL (ref 0.0–1.0)
pH: 7 (ref 5.0–8.0)

## 2014-06-04 LAB — WET PREP, GENITAL
CLUE CELLS WET PREP: NONE SEEN
Trich, Wet Prep: NONE SEEN
YEAST WET PREP: NONE SEEN

## 2014-06-04 LAB — CBC WITH DIFFERENTIAL/PLATELET
BASOS ABS: 0 10*3/uL (ref 0.0–0.1)
Basophils Relative: 0 % (ref 0–1)
EOS ABS: 0.1 10*3/uL (ref 0.0–0.7)
Eosinophils Relative: 1 % (ref 0–5)
HEMATOCRIT: 39.8 % (ref 36.0–46.0)
Hemoglobin: 13.9 g/dL (ref 12.0–15.0)
Lymphocytes Relative: 11 % — ABNORMAL LOW (ref 12–46)
Lymphs Abs: 1.3 10*3/uL (ref 0.7–4.0)
MCH: 30.6 pg (ref 26.0–34.0)
MCHC: 34.9 g/dL (ref 30.0–36.0)
MCV: 87.7 fL (ref 78.0–100.0)
MONO ABS: 0.4 10*3/uL (ref 0.1–1.0)
MONOS PCT: 4 % (ref 3–12)
Neutro Abs: 10.2 10*3/uL — ABNORMAL HIGH (ref 1.7–7.7)
Neutrophils Relative %: 84 % — ABNORMAL HIGH (ref 43–77)
Platelets: 231 10*3/uL (ref 150–400)
RBC: 4.54 MIL/uL (ref 3.87–5.11)
RDW: 12.9 % (ref 11.5–15.5)
WBC: 12.1 10*3/uL — ABNORMAL HIGH (ref 4.0–10.5)

## 2014-06-04 LAB — URINE MICROSCOPIC-ADD ON

## 2014-06-04 LAB — COMPREHENSIVE METABOLIC PANEL
ALT: 13 U/L (ref 0–35)
AST: 16 U/L (ref 0–37)
Albumin: 4.1 g/dL (ref 3.5–5.2)
Alkaline Phosphatase: 50 U/L (ref 39–117)
Anion gap: 8 (ref 5–15)
BUN: 8 mg/dL (ref 6–23)
CO2: 22 mmol/L (ref 19–32)
Calcium: 8.9 mg/dL (ref 8.4–10.5)
Chloride: 109 mmol/L (ref 96–112)
Creatinine, Ser: 0.48 mg/dL — ABNORMAL LOW (ref 0.50–1.10)
GFR calc Af Amer: >90 mL/min (ref >90)
GFR calc non Af Amer: >90 mL/min (ref >90)
GLUCOSE: 101 mg/dL — AB (ref 70–99)
POTASSIUM: 3.8 mmol/L (ref 3.5–5.1)
Sodium: 139 mmol/L (ref 135–145)
TOTAL PROTEIN: 6.6 g/dL (ref 6.0–8.3)
Total Bilirubin: 0.4 mg/dL (ref 0.3–1.2)

## 2014-06-04 LAB — HCG, QUANTITATIVE, PREGNANCY

## 2014-06-04 LAB — I-STAT BETA HCG BLOOD, ED (MC, WL, AP ONLY): I-stat hCG, quantitative: <5 m[IU]/mL (ref <5)

## 2014-06-04 LAB — LIPASE, BLOOD: Lipase: 19 U/L (ref 11–59)

## 2014-06-04 MED ORDER — CEFTRIAXONE SODIUM 1 G IJ SOLR
1.0000 g | Freq: Once | INTRAMUSCULAR | Status: AC
  Administered 2014-06-04: 1 g via INTRAVENOUS
  Filled 2014-06-04: qty 10

## 2014-06-04 MED ORDER — CEPHALEXIN 500 MG PO CAPS: 500.0000 mg | ORAL_CAPSULE | Freq: Four times a day (QID) | ORAL | Status: DC

## 2014-06-04 MED ORDER — MORPHINE SULFATE 4 MG/ML IJ SOLN
4.0000 mg | Freq: Once | INTRAMUSCULAR | Status: AC
  Administered 2014-06-04: 4 mg via INTRAVENOUS
  Filled 2014-06-04: qty 1

## 2014-06-04 MED ORDER — LIDOCAINE HCL (PF) 1 % IJ SOLN
5.0000 mL | Freq: Once | INTRAMUSCULAR | Status: DC
  Filled 2014-06-04: qty 5

## 2014-06-04 MED ORDER — HYDROCODONE-ACETAMINOPHEN 5-325 MG PO TABS: 2.0000 | ORAL_TABLET | ORAL | Status: DC | PRN

## 2014-06-04 MED ORDER — NAPROXEN 500 MG PO TABS: 500.0000 mg | ORAL_TABLET | Freq: Two times a day (BID) | ORAL | Status: DC

## 2014-06-04 MED ORDER — IBUPROFEN 800 MG PO TABS
800.0000 mg | ORAL_TABLET | Freq: Once | ORAL | Status: AC
  Administered 2014-06-04: 800 mg via ORAL
  Filled 2014-06-04: qty 1

## 2014-06-04 MED ORDER — IOHEXOL 300 MG/ML  SOLN
100.0000 mL | Freq: Once | INTRAMUSCULAR | Status: AC | PRN
  Administered 2014-06-04: 100 mL via INTRAVENOUS

## 2014-06-04 MED ORDER — ONDANSETRON HCL 4 MG PO TABS: 4.0000 mg | ORAL_TABLET | Freq: Four times a day (QID) | ORAL | Status: DC

## 2014-06-04 MED ORDER — ONDANSETRON HCL 4 MG/2ML IJ SOLN
4.0000 mg | Freq: Once | INTRAMUSCULAR | Status: AC
  Administered 2014-06-04: 4 mg via INTRAVENOUS
  Filled 2014-06-04: qty 2

## 2014-06-04 MED ORDER — CEFTRIAXONE SODIUM 250 MG IJ SOLR
250.0000 mg | Freq: Once | INTRAMUSCULAR | Status: DC
  Filled 2014-06-04: qty 250

## 2014-06-04 MED ORDER — SODIUM CHLORIDE 0.9 % IV BOLUS (SEPSIS)
1000.0000 mL | Freq: Once | INTRAVENOUS | Status: AC
  Administered 2014-06-04: 1000 mL via INTRAVENOUS

## 2014-06-04 MED ORDER — AZITHROMYCIN 250 MG PO TABS
1000.0000 mg | ORAL_TABLET | Freq: Once | ORAL | Status: AC
  Administered 2014-06-04: 1000 mg via ORAL
  Filled 2014-06-04: qty 4

## 2014-06-04 NOTE — ED Notes (Signed)
Pt returned from US

## 2014-06-04 NOTE — ED Notes (Addendum)
Pt reports waking up this am with severe pain to pelvic area and rectal area. Pt states "it feels like something is coming out of her uterus." lmp 1/20. Denies vaginal bleeding or discharge, unable to urinate this am.

## 2014-06-04 NOTE — ED Notes (Signed)
Pt states she is okay and doesn't feel like she needs anything else for pain at this time.

## 2014-06-04 NOTE — ED Notes (Signed)
Patient transported to Ultrasound 

## 2014-06-04 NOTE — Discharge Instructions (Signed)
Ovarian Cyst Take the antibiotics as prescribed. Follow up with your doctor.  You need a repeat pelvic ultrasound in 8-12 weeks. Return to the ED if you develop new or worsening symptmos. An ovarian cyst is a fluid-filled sac that forms on an ovary. The ovaries are small organs that produce eggs in women. Various types of cysts can form on the ovaries. Most are not cancerous. Many do not cause problems, and they often go away on their own. Some may cause symptoms and require treatment. Common types of ovarian cysts include:  Functional cysts--These cysts may occur every month during the menstrual cycle. This is normal. The cysts usually go away with the next menstrual cycle if the woman does not get pregnant. Usually, there are no symptoms with a functional cyst.  Endometrioma cysts--These cysts form from the tissue that lines the uterus. They are also called "chocolate cysts" because they become filled with blood that turns brown. This type of cyst can cause pain in the lower abdomen during intercourse and with your menstrual period.  Cystadenoma cysts--This type develops from the cells on the outside of the ovary. These cysts can get very big and cause lower abdomen pain and pain with intercourse. This type of cyst can twist on itself, cut off its blood supply, and cause severe pain. It can also easily rupture and cause a lot of pain.  Dermoid cysts--This type of cyst is sometimes found in both ovaries. These cysts may contain different kinds of body tissue, such as skin, teeth, hair, or cartilage. They usually do not cause symptoms unless they get very big.  Theca lutein cysts--These cysts occur when too much of a certain hormone (human chorionic gonadotropin) is produced and overstimulates the ovaries to produce an egg. This is most common after procedures used to assist with the conception of a baby (in vitro fertilization). CAUSES   Fertility drugs can cause a condition in which multiple large  cysts are formed on the ovaries. This is called ovarian hyperstimulation syndrome.  A condition called polycystic ovary syndrome can cause hormonal imbalances that can lead to nonfunctional ovarian cysts. SIGNS AND SYMPTOMS  Many ovarian cysts do not cause symptoms. If symptoms are present, they may include:  Pelvic pain or pressure.  Pain in the lower abdomen.  Pain during sexual intercourse.  Increasing girth (swelling) of the abdomen.  Abnormal menstrual periods.  Increasing pain with menstrual periods.  Stopping having menstrual periods without being pregnant. DIAGNOSIS  These cysts are commonly found during a routine or annual pelvic exam. Tests may be ordered to find out more about the cyst. These tests may include:  Ultrasound.  X-ray of the pelvis.  CT scan.  MRI.  Blood tests. TREATMENT  Many ovarian cysts go away on their own without treatment. Your health care provider may want to check your cyst regularly for 2-3 months to see if it changes. For women in menopause, it is particularly important to monitor a cyst closely because of the higher rate of ovarian cancer in menopausal women. When treatment is needed, it may include any of the following:  A procedure to drain the cyst (aspiration). This may be done using a long needle and ultrasound. It can also be done through a laparoscopic procedure. This involves using a thin, lighted tube with a tiny camera on the end (laparoscope) inserted through a small incision.  Surgery to remove the whole cyst. This may be done using laparoscopic surgery or an open surgery involving a  larger incision in the lower abdomen.  Hormone treatment or birth control pills. These methods are sometimes used to help dissolve a cyst. HOME CARE INSTRUCTIONS   Only take over-the-counter or prescription medicines as directed by your health care provider.  Follow up with your health care provider as directed.  Get regular pelvic exams and  Pap tests. SEEK MEDICAL CARE IF:   Your periods are late, irregular, or painful, or they stop.  Your pelvic pain or abdominal pain does not go away.  Your abdomen becomes larger or swollen.  You have pressure on your bladder or trouble emptying your bladder completely.  You have pain during sexual intercourse.  You have feelings of fullness, pressure, or discomfort in your stomach.  You lose weight for no apparent reason.  You feel generally ill.  You become constipated.  You lose your appetite.  You develop acne.  You have an increase in body and facial hair.  You are gaining weight, without changing your exercise and eating habits.  You think you are pregnant. SEEK IMMEDIATE MEDICAL CARE IF:   You have increasing abdominal pain.  You feel sick to your stomach (nauseous), and you throw up (vomit).  You develop a fever that comes on suddenly.  You have abdominal pain during a bowel movement.  Your menstrual periods become heavier than usual. MAKE SURE YOU:  Understand these instructions.  Will watch your condition.  Will get help right away if you are not doing well or get worse. Document Released: 04/09/2005 Document Revised: 04/14/2013 Document Reviewed: 12/15/2012 [facility] Patient Information 2015 [place], [place]. This information is not intended to replace advice given to you by your health care provider. Make sure you discuss any questions you have with your health care provider.  Urinary Tract Infection Urinary tract infections (UTIs) can develop anywhere along your urinary tract. Your urinary tract is your body's drainage system for removing wastes and extra water. Your urinary tract includes two kidneys, two ureters, a bladder, and a urethra. Your kidneys are a pair of bean-shaped organs. Each kidney is about the size of your fist. They are located below your ribs, one on each side of your spine. CAUSES Infections are caused by microbes, which are  microscopic organisms, including fungi, viruses, and bacteria. These organisms are so small that they can only be seen through a microscope. Bacteria are the microbes that most commonly cause UTIs. SYMPTOMS  Symptoms of UTIs may vary by age and gender of the patient and by the location of the infection. Symptoms in young women typically include a frequent and intense urge to urinate and a painful, burning feeling in the bladder or urethra during urination. Older women and men are more likely to be tired, shaky, and weak and have muscle aches and abdominal pain. A fever may mean the infection is in your kidneys. Other symptoms of a kidney infection include pain in your back or sides below the ribs, nausea, and vomiting. DIAGNOSIS To diagnose a UTI, your caregiver will ask you about your symptoms. Your caregiver also will ask to provide a urine sample. The urine sample will be tested for bacteria and white blood cells. White blood cells are made by your body to help fight infection. TREATMENT  Typically, UTIs can be treated with medication. Because most UTIs are caused by a bacterial infection, they usually can be treated with the use of antibiotics. The choice of antibiotic and length of treatment depend on your symptoms and the type of bacteria  causing your infection. HOME CARE INSTRUCTIONS  If you were prescribed antibiotics, take them exactly as your caregiver instructs you. Finish the medication even if you feel better after you have only taken some of the medication.  Drink enough water and fluids to keep your urine clear or pale yellow.  Avoid caffeine, tea, and carbonated beverages. They tend to irritate your bladder.  Empty your bladder often. Avoid holding urine for long periods of time.  Empty your bladder before and after sexual intercourse.  After a bowel movement, women should cleanse from front to back. Use each tissue only once. SEEK MEDICAL CARE IF:   You have back pain.  You  develop a fever.  Your symptoms do not begin to resolve within 3 days. SEEK IMMEDIATE MEDICAL CARE IF:   You have severe back pain or lower abdominal pain.  You develop chills.  You have nausea or vomiting.  You have continued burning or discomfort with urination. MAKE SURE YOU:   Understand these instructions.  Will watch your condition.  Will get help right away if you are not doing well or get worse. Document Released: 01/17/2005 Document Revised: 10/09/2011 Document Reviewed: 05/18/2011 [facility] Patient Information 2015 [place], [place]. This information is not intended to replace advice given to you by your health care provider. Make sure you discuss any questions you have with your health care provider.

## 2014-06-04 NOTE — ED Provider Notes (Signed)
CSN: [id number]     Arrival date & time 06/04/14  1044 History  This chart was scribed for Diane Octave, MD by Leone Payor, ED Scribe. This patient was seen in room D34C/D34C and the patient's care was started 10:54 AM.      Chief Complaint  Patient presents with  . Abdominal Pain   The history is provided by the patient. No language interpreter was used.     HPI Comments: Diane Glass is a 24 y.o. female who presents to the Emergency Department complaining of 3 hours of sudden onset, constant, lower and suprapubic abdominal pain and vaginal pressure. She states it feels as though "something is coming out of my uterus". She also states she is unable to void. She denies similar symptoms in the past but not this severe. She denies history of abdominal surgeries or prior pregnancies. She denies CP, SOB, vaginal bleeding, vaginal discharge, gush of fluid, back pain. LMP January 20th  Past Medical History  Diagnosis Date  . JRA (juvenile rheumatoid arthritis)   . Scoliosis   . Chlamydia 03/2011, 10/2011  . Migraine   . Asthma   . HSV (herpes simplex virus) anogenital infection 10 after her/'2014   Past Surgical History  Procedure Laterality Date  . Scoliosis surgery  2009   Family History  Problem Relation Age of Onset  . Cancer Other     unsure    History  Substance Use Topics  . Smoking status: Current Every Day Smoker -- 0.50 packs/day    Types: Cigarettes  . Smokeless tobacco: Never Used     Comment: two cigarettes a day  . Alcohol Use: No   OB History    Gravida Para Term Preterm AB TAB SAB Ectopic Multiple Living   0              Review of Systems  A complete 10 system review of systems was obtained and all systems are negative except as noted in the HPI and PMH.    Allergies  Review of patient's allergies indicates no known allergies.  Home Medications   Prior to Admission medications   Medication Sig Start Date End Date Taking? Authorizing Provider   cephALEXin (KEFLEX) 500 MG capsule Take 1 capsule (500 mg total) by mouth 4 (four) times daily. 06/04/14   Diane Octave, MD  HYDROcodone-acetaminophen (NORCO/VICODIN) 5-325 MG per tablet Take 2 tablets by mouth every 4 (four) hours as needed. 06/04/14   Diane Octave, MD  naproxen (NAPROSYN) 500 MG tablet Take 1 tablet (500 mg total) by mouth 2 (two) times daily. 06/04/14   Diane Octave, MD  ondansetron (ZOFRAN) 4 MG tablet Take 1 tablet (4 mg total) by mouth every 6 (six) hours. 06/04/14   Diane Octave, MD   BP 108/58 mmHg  Pulse 73  Temp(Src) 98.8 F (37.1 C) (Oral)  Resp 18  SpO2 99%  LMP 05/12/2014 Physical Exam  Constitutional: She is oriented to person, place, and time. She appears well-developed and well-nourished.  Uncomfortable, writhing in bed, tearful  HENT:  Head: Normocephalic and atraumatic.  Mouth/Throat: Oropharynx is clear and moist. No oropharyngeal exudate.  Eyes: Conjunctivae and EOM are normal. Pupils are equal, round, and reactive to light.  Neck: Normal range of motion. Neck supple.  No meningismus.  Cardiovascular: Normal rate, regular rhythm, normal heart sounds and intact distal pulses.   No murmur heard. Pulmonary/Chest: Effort normal and breath sounds normal. No respiratory distress.  Abdominal: Soft. She exhibits distension. There is tenderness (  diffusely but worse to suprapubic region). There is no rebound and no guarding.  Genitourinary:  No CVA tenderness.  Pelvic Exam, Chaperone present:  Normal external genitalia. Scant white discharge. +CMT, +uterine tenderness. No lateralizing adnexal tenderness   Musculoskeletal: Normal range of motion. She exhibits tenderness (paraspinal lumbar tenderness ). She exhibits no edema.  Neurological: She is alert and oriented to person, place, and time. No cranial nerve deficit. She exhibits normal muscle tone. Coordination normal.  No ataxia on finger to nose bilaterally. No pronator drift. 5/5 strength  throughout. CN 2-12 intact. Negative Romberg. Equal grip strength. Sensation intact. Gait is normal.   Skin: Skin is warm.  Psychiatric: She has a normal mood and affect. Her behavior is normal.  Nursing note and vitals reviewed.   ED Course  Procedures (including critical care time)  DIAGNOSTIC STUDIES: Oxygen Saturation is 99% on RA, normal by my interpretation.    COORDINATION OF CARE: 11:04 AM Will order labs, pregnancy test; will give Zofran and IV fluids. Discussed treatment plan with pt at bedside and pt agreed to plan.   Labs Review Labs Reviewed  WET PREP, GENITAL - Abnormal; Notable for the following:    WBC, Wet Prep HPF POC MANY (*)    All other components within normal limits  CBC WITH DIFFERENTIAL/PLATELET - Abnormal; Notable for the following:    WBC 12.1 (*)    Neutrophils Relative % 84 (*)    Neutro Abs 10.2 (*)    Lymphocytes Relative 11 (*)    All other components within normal limits  COMPREHENSIVE METABOLIC PANEL - Abnormal; Notable for the following:    Glucose, Bld 101 (*)    Creatinine, Ser 0.48 (*)    All other components within normal limits  URINALYSIS, ROUTINE W REFLEX MICROSCOPIC - Abnormal; Notable for the following:    APPearance CLOUDY (*)    Nitrite POSITIVE (*)    Leukocytes, UA MODERATE (*)    All other components within normal limits  URINE MICROSCOPIC-ADD ON - Abnormal; Notable for the following:    Squamous Epithelial / LPF FEW (*)    Bacteria, UA MANY (*)    All other components within normal limits  HCG, QUANTITATIVE, PREGNANCY  LIPASE, BLOOD  I-STAT BETA HCG BLOOD, ED ([facility], [facility], [facility] ONLY)  GC/CHLAMYDIA PROBE AMP ([facility])    Imaging Review US Transvaginal Non-ob  06/04/2014   CLINICAL DATA:  Pelvic pain and pressure, primarily on right  EXAM: TRANSABDOMINAL AND TRANSVAGINAL ULTRASOUND OF PELVIS  DOPPLER ULTRASOUND OF OVARIES  TECHNIQUE: Study was performed transabdominally to optimize pelvic field of view evaluation and  transvaginally to optimize pelvic visceral architecture evaluation.  Color and duplex Doppler ultrasound was utilized to evaluate blood flow to the ovaries.  COMPARISON:  June 24, 2012  FINDINGS: Uterus  Measurements: 7.8 x 3.4 x 5.0 cm. No fibroids or other mass visualized.  Endometrium  Thickness: 13 mm. No focal abnormality visualized. Endometrial contour is smooth  Right ovary  Measurements: 5.1 x 4.7 x 5.1 cm. Within the right ovary, there is a somewhat complex mass measuring 3.6 x 3.0 x 3.0 cm. No other right adnexal mass.  Left ovary  Measurements: 3.9 x 2.0 x 3.4 cm. Normal appearance/no adnexal mass.  Pulsed Doppler evaluation of both ovaries demonstrates normal low-resistance arterial and venous waveforms. The peak systolic velocity in the right ovary is 18 cm/sec. Peak systolic velocity in the left ovary is 11 cm/sec.  Other findings  There is moderate free fluid  is seen primarily superior to the uterus.  IMPRESSION: Complex right ovarian mass, most likely a hemorrhagic cyst. An endometrioma could present somewhat similarly. Short-interval follow up ultrasound in 6-12 weeks is recommended, preferably during the week following the patient's normal menses.  Moderate free fluid in the pelvis. Question recent ovarian cyst rupture.  No ovarian torsion.  Uterus unremarkable in appearance.   Electronically Signed   By: Bretta Bang III M.D.   On: 06/04/2014 14:40   US Pelvis Complete  06/04/2014   CLINICAL DATA:  Pelvic pain and pressure, primarily on right  EXAM: TRANSABDOMINAL AND TRANSVAGINAL ULTRASOUND OF PELVIS  DOPPLER ULTRASOUND OF OVARIES  TECHNIQUE: Study was performed transabdominally to optimize pelvic field of view evaluation and transvaginally to optimize pelvic visceral architecture evaluation.  Color and duplex Doppler ultrasound was utilized to evaluate blood flow to the ovaries.  COMPARISON:  June 24, 2012  FINDINGS: Uterus  Measurements: 7.8 x 3.4 x 5.0 cm. No fibroids or other mass  visualized.  Endometrium  Thickness: 13 mm. No focal abnormality visualized. Endometrial contour is smooth  Right ovary  Measurements: 5.1 x 4.7 x 5.1 cm. Within the right ovary, there is a somewhat complex mass measuring 3.6 x 3.0 x 3.0 cm. No other right adnexal mass.  Left ovary  Measurements: 3.9 x 2.0 x 3.4 cm. Normal appearance/no adnexal mass.  Pulsed Doppler evaluation of both ovaries demonstrates normal low-resistance arterial and venous waveforms. The peak systolic velocity in the right ovary is 18 cm/sec. Peak systolic velocity in the left ovary is 11 cm/sec.  Other findings  There is moderate free fluid is seen primarily superior to the uterus.  IMPRESSION: Complex right ovarian mass, most likely a hemorrhagic cyst. An endometrioma could present somewhat similarly. Short-interval follow up ultrasound in 6-12 weeks is recommended, preferably during the week following the patient's normal menses.  Moderate free fluid in the pelvis. Question recent ovarian cyst rupture.  No ovarian torsion.  Uterus unremarkable in appearance.   Electronically Signed   By: Bretta Bang III M.D.   On: 06/04/2014 14:40   Ct Abdomen Pelvis W Contrast  06/04/2014   CLINICAL DATA:  Pt had an ultrasound today that showed possible ovarian cyst rupture. Ct scan for further evaluation. Pt has been having pelvic pain since this morning. Pt also describes severe abdominal cramping, but states the cramps are unlike her normal menstrual cramps.  EXAM: CT ABDOMEN AND PELVIS WITH CONTRAST  TECHNIQUE: Multidetector CT imaging of the abdomen and pelvis was performed using the standard protocol following bolus administration of intravenous contrast.  CONTRAST:  OMNIPAQUE IOHEXOL 300 MG/ML  SOLN  COMPARISON:  Ultrasound of the pelvis 06/04/2014  FINDINGS: Lower chest: Lung bases are unremarkable.  Heart size is normal.  Upper abdomen: No focal abnormality within the liver, spleen, pancreas, or adrenal glands. Kidneys show normal  enhancement bilaterally. Gallbladder is present.  Gastrointestinal tract: Stomach and small bowel loops are normal in appearance. The appendix is well seen and has a normal appearance. Colonic loops are normal in appearance.  Pelvis: The uterus is present. Right adnexal cyst is again noted, measuring up to 3.7 cm by CT measurements. Along the lateral anterior wall of the cyst there is a possible mural nodule measuring 2.1 cm. Left ovary is unremarkable in appearance. There is moderate free pelvic fluid.  Retroperitoneum: No retroperitoneal or mesenteric adenopathy. No evidence for aortic aneurysm.  Abdominal wall: Unremarkable.  Osseous structures: Unremarkable.  IMPRESSION: 1. Right adnexal cystic  structure, 3.7 cm. 2.1 cm possible mural nodule. Ultrasound followup is recommended in 8-12 weeks. 2. Moderate free pelvic fluid. 3. Normal appendix.   Electronically Signed   By: Norva Pavlov M.D.   On: 06/04/2014 17:28   [place] Art/ven Flow Abd Pelv Doppler  06/04/2014   CLINICAL DATA:  Pelvic pain and pressure, primarily on right  EXAM: TRANSABDOMINAL AND TRANSVAGINAL ULTRASOUND OF PELVIS  DOPPLER ULTRASOUND OF OVARIES  TECHNIQUE: Study was performed transabdominally to optimize pelvic field of view evaluation and transvaginally to optimize pelvic visceral architecture evaluation.  Color and duplex Doppler ultrasound was utilized to evaluate blood flow to the ovaries.  COMPARISON:  June 24, 2012  FINDINGS: Uterus  Measurements: 7.8 x 3.4 x 5.0 cm. No fibroids or other mass visualized.  Endometrium  Thickness: 13 mm. No focal abnormality visualized. Endometrial contour is smooth  Right ovary  Measurements: 5.1 x 4.7 x 5.1 cm. Within the right ovary, there is a somewhat complex mass measuring 3.6 x 3.0 x 3.0 cm. No other right adnexal mass.  Left ovary  Measurements: 3.9 x 2.0 x 3.4 cm. Normal appearance/no adnexal mass.  Pulsed Doppler evaluation of both ovaries demonstrates normal low-resistance arterial and venous  waveforms. The peak systolic velocity in the right ovary is 18 cm/sec. Peak systolic velocity in the left ovary is 11 cm/sec.  Other findings  There is moderate free fluid is seen primarily superior to the uterus.  IMPRESSION: Complex right ovarian mass, most likely a hemorrhagic cyst. An endometrioma could present somewhat similarly. Short-interval follow up ultrasound in 6-12 weeks is recommended, preferably during the week following the patient's normal menses.  Moderate free fluid in the pelvis. Question recent ovarian cyst rupture.  No ovarian torsion.  Uterus unremarkable in appearance.   Electronically Signed   By: Bretta Bang III M.D.   On: 06/04/2014 14:40     EKG Interpretation   Date/Time:  Friday June 04 2014 11:32:48 EST Ventricular Rate:  72 PR Interval:  142 QRS Duration: 105 QT Interval:  387 QTC Calculation: 423 R Axis:   69 Text Interpretation:  Sinus arrhythmia RSR' in V1 or V2, probably normal  variant Nonspecific T abnrm, anterolateral leads ST elev, probable normal  early repol pattern No significant change was found Confirmed by Manus Gunning   MD, Sharon Stapel [phone]) on 06/04/2014 11:41:53 AM      MDM   Final diagnoses:  Pelvic pain in female  Urinary tract infection without hematuria, site unspecified  Cyst of right ovary   patient awoke with severe pelvic pain radiating to her bottom. She states it feels like something is coming out of my uterus". States last menstrual cycle last month. Denies any vaginal bleeding or gush of fluid.  Pregnancy test is negative. Pelvic exam is benign though remarkable for CMT with uterine tenderness. We'll treat empirically for PID. Pain is resolved with morphine.  Labs reassuring. [place] with large adnexal cyst and free fluid, no torsion. Treat UTI, probable pyelonephritis.  patient feeling improved.  Tolerating PO.  Understands need for repeat US in 8-12 weeks. Follow up with PCP and GYN.   I personally performed the services  described in this documentation, which was scribed in my presence. The recorded information has been reviewed and is accurate.   Diane Octave, MD 06/04/14 [phone]

## 2014-06-04 NOTE — ED Notes (Signed)
Pt transported to CT ?

## 2014-06-04 NOTE — ED Notes (Signed)
Dr. Manus Gunning made aware pt took the pills but did not want the rocephin injection. No further orders received.

## 2014-06-07 ENCOUNTER — Ambulatory Visit (HOSPITAL_COMMUNITY): Payer: Managed Care, Other (non HMO) | Attending: Emergency Medicine | Admitting: Radiology

## 2014-06-07 DIAGNOSIS — R55 Syncope and collapse: Secondary | ICD-10-CM

## 2014-06-07 NOTE — Progress Notes (Signed)
Echocardiogram performed.  

## 2014-06-11 ENCOUNTER — Encounter (HOSPITAL_COMMUNITY): Payer: Self-pay | Admitting: *Deleted

## 2014-06-11 ENCOUNTER — Emergency Department (HOSPITAL_COMMUNITY)
Admission: EM | Admit: 2014-06-11 | Discharge: 2014-06-11 | Disposition: A | Discharge status: Home / Self Care | Payer: Managed Care, Other (non HMO) | Attending: Emergency Medicine | Admitting: Emergency Medicine

## 2014-06-11 ENCOUNTER — Emergency Department (HOSPITAL_COMMUNITY): Payer: Managed Care, Other (non HMO)

## 2014-06-11 DIAGNOSIS — R1084 Generalized abdominal pain: Secondary | ICD-10-CM | POA: Diagnosis not present

## 2014-06-11 DIAGNOSIS — J45909 Unspecified asthma, uncomplicated: Secondary | ICD-10-CM | POA: Insufficient documentation

## 2014-06-11 DIAGNOSIS — Z791 Long term (current) use of non-steroidal anti-inflammatories (NSAID): Secondary | ICD-10-CM | POA: Diagnosis not present

## 2014-06-11 DIAGNOSIS — Z8619 Personal history of other infectious and parasitic diseases: Secondary | ICD-10-CM | POA: Diagnosis not present

## 2014-06-11 DIAGNOSIS — R141 Gas pain: Secondary | ICD-10-CM | POA: Diagnosis not present

## 2014-06-11 DIAGNOSIS — M0800 Unspecified juvenile rheumatoid arthritis of unspecified site: Secondary | ICD-10-CM | POA: Insufficient documentation

## 2014-06-11 DIAGNOSIS — Z720 Tobacco use: Secondary | ICD-10-CM | POA: Insufficient documentation

## 2014-06-11 DIAGNOSIS — Z792 Long term (current) use of antibiotics: Secondary | ICD-10-CM | POA: Diagnosis not present

## 2014-06-11 DIAGNOSIS — IMO0001 Reserved for inherently not codable concepts without codable children: Secondary | ICD-10-CM

## 2014-06-11 DIAGNOSIS — Z8679 Personal history of other diseases of the circulatory system: Secondary | ICD-10-CM | POA: Diagnosis not present

## 2014-06-11 LAB — CBC WITH DIFFERENTIAL/PLATELET
BASOS ABS: 0 10*3/uL (ref 0.0–0.1)
BASOS PCT: 1 % (ref 0–1)
EOS PCT: 3 % (ref 0–5)
Eosinophils Absolute: 0.2 10*3/uL (ref 0.0–0.7)
HCT: 39 % (ref 36.0–46.0)
Hemoglobin: 13.5 g/dL (ref 12.0–15.0)
Lymphocytes Relative: 21 % (ref 12–46)
Lymphs Abs: 1.4 10*3/uL (ref 0.7–4.0)
MCH: 30.2 pg (ref 26.0–34.0)
MCHC: 34.6 g/dL (ref 30.0–36.0)
MCV: 87.2 fL (ref 78.0–100.0)
Monocytes Absolute: 0.5 10*3/uL (ref 0.1–1.0)
Monocytes Relative: 8 % (ref 3–12)
Neutro Abs: 4.6 10*3/uL (ref 1.7–7.7)
Neutrophils Relative %: 67 % (ref 43–77)
PLATELETS: 220 10*3/uL (ref 150–400)
RBC: 4.47 MIL/uL (ref 3.87–5.11)
RDW: 12.9 % (ref 11.5–15.5)
WBC: 6.8 10*3/uL (ref 4.0–10.5)

## 2014-06-11 LAB — COMPREHENSIVE METABOLIC PANEL
ALBUMIN: 4.2 g/dL (ref 3.5–5.2)
ALK PHOS: 49 U/L (ref 39–117)
ALT: 12 U/L (ref 0–35)
AST: 15 U/L (ref 0–37)
Anion gap: 6 (ref 5–15)
BILIRUBIN TOTAL: 0.4 mg/dL (ref 0.3–1.2)
BUN: 11 mg/dL (ref 6–23)
CHLORIDE: 107 mmol/L (ref 96–112)
CO2: 25 mmol/L (ref 19–32)
Calcium: 9.1 mg/dL (ref 8.4–10.5)
Creatinine, Ser: 0.59 mg/dL (ref 0.50–1.10)
GFR calc Af Amer: >90 mL/min (ref >90)
GFR calc non Af Amer: >90 mL/min (ref >90)
Glucose, Bld: 65 mg/dL — ABNORMAL LOW (ref 70–99)
Potassium: 4.2 mmol/L (ref 3.5–5.1)
Sodium: 138 mmol/L (ref 135–145)
TOTAL PROTEIN: 6.3 g/dL (ref 6.0–8.3)

## 2014-06-11 LAB — URINALYSIS, ROUTINE W REFLEX MICROSCOPIC
BILIRUBIN URINE: NEGATIVE
GLUCOSE, UA: NEGATIVE mg/dL
Ketones, ur: NEGATIVE mg/dL
Leukocytes, UA: NEGATIVE
Nitrite: NEGATIVE
PH: 7.5 (ref 5.0–8.0)
Protein, ur: NEGATIVE mg/dL
SPECIFIC GRAVITY, URINE: 1.015 (ref 1.005–1.030)
UROBILINOGEN UA: 0.2 mg/dL (ref 0.0–1.0)

## 2014-06-11 LAB — URINE MICROSCOPIC-ADD ON

## 2014-06-11 LAB — LIPASE, BLOOD: LIPASE: 30 U/L (ref 11–59)

## 2014-06-11 MED ORDER — ONDANSETRON HCL 4 MG/2ML IJ SOLN
4.0000 mg | Freq: Once | INTRAMUSCULAR | Status: AC
  Administered 2014-06-11: 4 mg via INTRAVENOUS
  Filled 2014-06-11: qty 2

## 2014-06-11 MED ORDER — DICYCLOMINE HCL 20 MG PO TABS: 20.0000 mg | ORAL_TABLET | Freq: Two times a day (BID) | ORAL | Status: DC

## 2014-06-11 MED ORDER — MORPHINE SULFATE 4 MG/ML IJ SOLN
4.0000 mg | Freq: Once | INTRAMUSCULAR | Status: AC
  Administered 2014-06-11: 4 mg via INTRAVENOUS
  Filled 2014-06-11: qty 1

## 2014-06-11 MED ORDER — MECLIZINE HCL 25 MG PO TABS: 25.0000 mg | ORAL_TABLET | Freq: Three times a day (TID) | ORAL | Status: DC | PRN

## 2014-06-11 NOTE — ED Notes (Signed)
Pt reports that she was seen on 2/12 for ruptured ovarian cyst and now having generalized abd pain and n/v. Denies diarrhea.

## 2014-06-11 NOTE — ED Provider Notes (Signed)
CSN: [id number]     Arrival date & time 06/11/14  1212 History   First MD Initiated Contact with Patient 06/11/14 1226     Chief Complaint  Patient presents with  . Abdominal Pain  . Emesis     (Consider location/radiation/quality/duration/timing/severity/associated sxs/prior Treatment) HPI   Diane Glass This 24 year old female who presents to the emergency department with chief complaint of abdominal pain. The patient complains of diffuse abdominal pain which is constant, colicky. 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 She was seen on the 12TH of this month and diagnosed with a ruptured ovarian cyst. She states she has had constant pain since that time, however, it has gotten acutely worse. Her pain worsened last night. She had one episode of vomiting, nonbilious, nonbloody vomitus. She denies constipation and had a bowel movement which she describes as normal in volume. Last night. She denies fevers, chills, diarrhea, melena or hematochezia. Patient has a past medical history of juvenile rheumatoid arthritis and takes methotrexate. She is sexually active and does not take any birth control. She denies any urinary or vaginal symptoms at this time.  Past Medical History  Diagnosis Date  . JRA (juvenile rheumatoid arthritis)   . Scoliosis   . Chlamydia 03/2011, 10/2011  . Migraine   . Asthma   . HSV (herpes simplex virus) anogenital infection 10 after her/'2014   Past Surgical History  Procedure Laterality Date  . Scoliosis surgery  2009   Family History  Problem Relation Age of Onset  . Cancer Other     unsure    History  Substance Use Topics  . Smoking status: Current Every Day Smoker -- 0.50 packs/day    Types: Cigarettes  . Smokeless tobacco: Never Used     Comment: two cigarettes a day  . Alcohol Use: No   OB History    Gravida Para Term Preterm AB TAB SAB Ectopic Multiple Living   0              Review  of Systems  Ten systems reviewed and are negative for acute change, except as noted in the HPI.    Allergies  Review of patient's allergies indicates no known allergies.  Home Medications   Prior to Admission medications   Medication Sig Start Date End Date Taking? Authorizing Provider  cephALEXin (KEFLEX) 500 MG capsule Take 1 capsule (500 mg total) by mouth 4 (four) times daily. 06/04/14   [name], MD  HYDROcodone-acetaminophen (NORCO/VICODIN) 5-325 MG per tablet Take 2 tablets by mouth every 4 (four) hours as needed. 06/04/14   [name], MD  naproxen (NAPROSYN) 500 MG tablet Take 1 tablet (500 mg total) by mouth 2 (two) times daily. 06/04/14   [name], MD  ondansetron (ZOFRAN) 4 MG tablet Take 1 tablet (4 mg total) by mouth every 6 (six) hours. 06/04/14   [name], MD   BP 106/64 mmHg  Pulse 62  Temp(Src) 97.7 F (36.5 C) (Oral)  Resp 16  SpO2 100%  LMP 05/12/2014 Physical Exam  Constitutional: She is oriented to person, place, and time. She appears well-developed and well-nourished. No distress.  HENT:  Head: Normocephalic and atraumatic.  Eyes: Conjunctivae are normal. No scleral icterus.  Neck: Normal range of motion.  Cardiovascular: Normal rate, regular rhythm and normal heart sounds.  Exam reveals no gallop and no friction rub.   No murmur heard. Pulmonary/Chest: Effort normal and breath sounds normal. No respiratory distress.  Abdominal: Soft. Bowel sounds are normal. She exhibits no distension and no mass. There is tenderness. There is no guarding.  Tympanic abdomen, diffuse abdominal tenderness without focal tenderness or guarding. No rebound.  Musculoskeletal: Normal range of motion.  Neurological: She is alert and oriented to person, place, and time.  Skin: Skin is warm and dry. She is not diaphoretic.  Nursing note and vitals reviewed.   ED Course  Procedures (including critical care time) Labs Review Labs Reviewed  COMPREHENSIVE  METABOLIC PANEL - Abnormal; Notable for the following:    Glucose, Bld 65 (*)    All other components within normal limits  URINALYSIS, ROUTINE W REFLEX MICROSCOPIC - Abnormal; Notable for the following:    Hgb urine dipstick SMALL (*)    All other components within normal limits  URINE MICROSCOPIC-ADD ON - Abnormal; Notable for the following:    Squamous Epithelial / LPF FEW (*)    All other components within normal limits  CBC WITH DIFFERENTIAL/PLATELET  LIPASE, BLOOD    Imaging Review Dg Abd Acute W/chest  06/11/2014   CLINICAL DATA:  Ovarian cysts, abdominal pain for 1 week, patient questions a ruptured ovarian cyst, history asthma  EXAM: ACUTE ABDOMEN SERIES (ABDOMEN 2 VIEW & CHEST 1 VIEW)  COMPARISON:  Chest radiograph 05/17/2008, CT abdomen and pelvis 06/04/2004  FINDINGS: Normal heart size, mediastinal contours, and pulmonary vascularity.  Lungs clear.  No pleural effusion or pneumothorax.  Dextro convex thoracic scoliosis post spinal fixation.  Levoconvex lumbar scoliosis.  Normal bowel gas pattern.  No bowel dilatation or bowel wall thickening or free intraperitoneal air.  No urinary tract calcification.  IMPRESSION: Scoliosis.  No acute abnormalities.   Electronically Signed   By: Ulyses Southward M.D.   On: 06/11/2014 14:03     EKG Interpretation None      MDM   Final diagnoses:  None   2:39 PM BP 106/64 mmHg  Pulse 62  Temp(Src) 97.7 F (36.5 C) (Oral)  Resp 16  SpO2 100%  LMP 05/12/2014 Patient with negative UA, CBC is normal without leukocytosis. She is slightly hypoglycemic, however, there is no sign of dehydration or elevation in her BUN for high specific gravity of urine. Her acute abdominal x-ray does show diffuse gas throughout the colon, which I suspect is the cause of her abdominal pain. She does not appear to have a surgical abdomen or acute abdomen.  We will by mouth challenge.     Patient is nontoxic, nonseptic appearing, in no apparent distress.   Patient's pain and other symptoms adequately managed in emergency department.  Fluid bolus given.  Labs, imaging and vitals reviewed.  Patient does not meet the SIRS or Sepsis criteria.  On repeat exam patient does not have a surgical abdomin and there are no peritoneal signs.  No indication of appendicitis, bowel obstruction, bowel perforation, cholecystitis, diverticulitis, PID or ectopic pregnancy.  Patient discharged home with symptomatic treatment and given strict instructions for follow-up with their primary care physician.  I have also discussed reasons to return immediately to the ER.  Patient expresses understanding and agrees with plan.      Arthor Captain, PA-C 06/11/14 1619  Juliet Rude. Rubin Payor, MD 06/15/14 [phone]

## 2014-06-11 NOTE — Discharge Instructions (Signed)
Abdominal (belly) pain can be caused by many things. Your caregiver performed an examination and possibly ordered blood/urine tests and imaging (CT scan, x-rays, ultrasound). Many cases can be observed and treated at home after initial evaluation in the emergency department. Even though you are being discharged home, abdominal pain can be unpredictable. Therefore, you need a repeated exam if your pain does not resolve, returns, or worsens. Most patients with abdominal pain don't have to be admitted to the hospital or have surgery, but serious problems like appendicitis and gallbladder attacks can start out as nonspecific pain. Many abdominal conditions cannot be diagnosed in one visit, so follow-up evaluations are very important. SEEK IMMEDIATE MEDICAL ATTENTION IF: The pain does not go away or becomes severe.  A temperature above 101 develops.  Repeated vomiting occurs (multiple episodes).  The pain becomes localized to portions of the abdomen. The right side could possibly be appendicitis. In an adult, the left lower portion of the abdomen could be colitis or diverticulitis.  Blood is being passed in stools or vomit (bright red or black tarry stools).  Return also if you develop chest pain, difficulty breathing, dizziness or fainting, or become confused, poorly responsive, or inconsolable (young children).   Bloating Bloating is the feeling of fullness in your belly. You may feel as though your pants are too tight. Often the cause of bloating is overeating, retaining fluids, or having gas in your bowel. It is also caused by swallowing air and eating foods that cause gas. Irritable bowel syndrome is one of the most common causes of bloating. Constipation is also a common cause. Sometimes more serious problems can cause bloating. SYMPTOMS  Usually there is a feeling of fullness, as though your abdomen is bulged out. There may be mild discomfort.  DIAGNOSIS  Usually no particular testing is necessary  for most bloating. If the condition persists and seems to become worse, your caregiver may do additional testing.  TREATMENT   There is no direct treatment for bloating.  Do not put gas into the bowel. Avoid chewing gum and sucking on candy. These tend to make you swallow air. Swallowing air can also be a nervous habit. Try to avoid this.  Avoiding high residue diets will help. Eat foods with soluble fibers (examples include root vegetables, apples, or barley) and substitute dairy products with soy and rice products. This helps irritable bowel syndrome.  If constipation is the cause, then a high residue diet with more fiber will help.  Avoid carbonated beverages.  Over-the-counter preparations are available that help reduce gas. Your pharmacist can help you with this. SEEK MEDICAL CARE IF:   Bloating continues and seems to be getting worse.  You notice a weight gain.  You have a weight loss but the bloating is getting worse.  You have changes in your bowel habits or develop nausea or vomiting. SEEK IMMEDIATE MEDICAL CARE IF:   You develop shortness of breath or swelling in your legs.  You have an increase in abdominal pain or develop chest pain. Document Released: 02/07/2006 Document Revised: 07/02/2011 Document Reviewed: 03/28/2007 [facility] Patient Information 2015 [place], [place]. This information is not intended to replace advice given to you by your health care provider. Make sure you discuss any questions you have with your health care provider.  Flatulence There are good germs in your gut to help you digest food. Gas is produced by these germs and released from your bottom. Most people release 3 to 4 quarts of gas every day.  This is normal. HOME CARE  Eat or drink less of the foods or liquids that give you gas.  Take the time to chew your food well. Talk less while you eat.  Do not suck on ice or hard candy.  Sip slowly. Stir some of the bubbles out of fizzy drinks with  a spoon or straw.  Avoid chewing gum or smoking.  Ask your doctor about liquids and tablets that may help control burping and gas.  Only take medicine as told by your doctor. GET HELP RIGHT AWAY IF:   There is discomfort when you burp or pass gas.  You throw up (vomit) when you burp.  Poop (stool) comes out when you pass gas.  Your belly is puffy (swollen) and hard. MAKE SURE YOU:   Understand these instructions.  Will watch your condition.  Will get help right away if you are not doing well or get worse. Document Released: 02/10/2008 Document Revised: 07/02/2011 Document Reviewed: 02/10/2008 [facility] Patient Information 2015 [place], [place]. This information is not intended to replace advice given to you by your health care provider. Make sure you discuss any questions you have with your health care provider.

## 2014-06-11 NOTE — ED Notes (Signed)
PA at bedside.

## 2014-06-29 ENCOUNTER — Emergency Department (HOSPITAL_COMMUNITY)
Admission: EM | Admit: 2014-06-29 | Discharge: 2014-06-29 | Disposition: A | Discharge status: Home / Self Care | Payer: Managed Care, Other (non HMO) | Attending: Emergency Medicine | Admitting: Emergency Medicine

## 2014-06-29 ENCOUNTER — Encounter (HOSPITAL_COMMUNITY): Payer: Self-pay

## 2014-06-29 DIAGNOSIS — J45909 Unspecified asthma, uncomplicated: Secondary | ICD-10-CM | POA: Diagnosis not present

## 2014-06-29 DIAGNOSIS — Z8619 Personal history of other infectious and parasitic diseases: Secondary | ICD-10-CM | POA: Diagnosis not present

## 2014-06-29 DIAGNOSIS — M0800 Unspecified juvenile rheumatoid arthritis of unspecified site: Secondary | ICD-10-CM | POA: Insufficient documentation

## 2014-06-29 DIAGNOSIS — Z792 Long term (current) use of antibiotics: Secondary | ICD-10-CM | POA: Diagnosis not present

## 2014-06-29 DIAGNOSIS — Z8679 Personal history of other diseases of the circulatory system: Secondary | ICD-10-CM | POA: Insufficient documentation

## 2014-06-29 DIAGNOSIS — Z720 Tobacco use: Secondary | ICD-10-CM | POA: Diagnosis not present

## 2014-06-29 DIAGNOSIS — R1032 Left lower quadrant pain: Secondary | ICD-10-CM | POA: Insufficient documentation

## 2014-06-29 DIAGNOSIS — Z791 Long term (current) use of non-steroidal anti-inflammatories (NSAID): Secondary | ICD-10-CM | POA: Insufficient documentation

## 2014-06-29 DIAGNOSIS — Z79899 Other long term (current) drug therapy: Secondary | ICD-10-CM | POA: Diagnosis not present

## 2014-06-29 DIAGNOSIS — L03221 Cellulitis of neck: Secondary | ICD-10-CM | POA: Diagnosis not present

## 2014-06-29 DIAGNOSIS — R21 Rash and other nonspecific skin eruption: Secondary | ICD-10-CM | POA: Diagnosis present

## 2014-06-29 DIAGNOSIS — Z3202 Encounter for pregnancy test, result negative: Secondary | ICD-10-CM | POA: Insufficient documentation

## 2014-06-29 DIAGNOSIS — Z8742 Personal history of other diseases of the female genital tract: Secondary | ICD-10-CM | POA: Insufficient documentation

## 2014-06-29 LAB — COMPREHENSIVE METABOLIC PANEL
ALBUMIN: 4.3 g/dL (ref 3.5–5.2)
ALT: 19 U/L (ref 0–35)
AST: 30 U/L (ref 0–37)
Alkaline Phosphatase: 50 U/L (ref 39–117)
Anion gap: 12 (ref 5–15)
BILIRUBIN TOTAL: 1 mg/dL (ref 0.3–1.2)
BUN: 12 mg/dL (ref 6–23)
CO2: 21 mmol/L (ref 19–32)
Calcium: 9 mg/dL (ref 8.4–10.5)
Chloride: 108 mmol/L (ref 96–112)
Creatinine, Ser: 0.7 mg/dL (ref 0.50–1.10)
GFR calc Af Amer: >90 mL/min (ref >90)
Glucose, Bld: 82 mg/dL (ref 70–99)
POTASSIUM: 3.7 mmol/L (ref 3.5–5.1)
SODIUM: 141 mmol/L (ref 135–145)
Total Protein: 7.1 g/dL (ref 6.0–8.3)

## 2014-06-29 LAB — CBC WITH DIFFERENTIAL/PLATELET
Basophils Absolute: 0.1 10*3/uL (ref 0.0–0.1)
Basophils Relative: 1 % (ref 0–1)
EOS PCT: 2 % (ref 0–5)
Eosinophils Absolute: 0.2 10*3/uL (ref 0.0–0.7)
HCT: 39.7 % (ref 36.0–46.0)
Hemoglobin: 13.5 g/dL (ref 12.0–15.0)
LYMPHS ABS: 2.1 10*3/uL (ref 0.7–4.0)
LYMPHS PCT: 21 % (ref 12–46)
MCH: 30.2 pg (ref 26.0–34.0)
MCHC: 34 g/dL (ref 30.0–36.0)
MCV: 88.8 fL (ref 78.0–100.0)
MONO ABS: 0.6 10*3/uL (ref 0.1–1.0)
MONOS PCT: 6 % (ref 3–12)
NEUTROS ABS: 7 10*3/uL (ref 1.7–7.7)
Neutrophils Relative %: 70 % (ref 43–77)
Platelets: 235 10*3/uL (ref 150–400)
RBC: 4.47 MIL/uL (ref 3.87–5.11)
RDW: 12.8 % (ref 11.5–15.5)
WBC: 9.9 10*3/uL (ref 4.0–10.5)

## 2014-06-29 LAB — URINALYSIS, ROUTINE W REFLEX MICROSCOPIC
Bilirubin Urine: NEGATIVE
GLUCOSE, UA: NEGATIVE mg/dL
Ketones, ur: 40 mg/dL — AB
Leukocytes, UA: NEGATIVE
Nitrite: NEGATIVE
Protein, ur: NEGATIVE mg/dL
Specific Gravity, Urine: 1.022 (ref 1.005–1.030)
Urobilinogen, UA: 0.2 mg/dL (ref 0.0–1.0)
pH: 5.5 (ref 5.0–8.0)

## 2014-06-29 LAB — URINE MICROSCOPIC-ADD ON

## 2014-06-29 LAB — POC URINE PREG, ED: Preg Test, Ur: NEGATIVE

## 2014-06-29 MED ORDER — SULFAMETHOXAZOLE-TRIMETHOPRIM 800-160 MG PO TABS: 2.0000 | ORAL_TABLET | Freq: Two times a day (BID) | ORAL | Status: DC

## 2014-06-29 NOTE — ED Provider Notes (Signed)
CSN: [id number]     Arrival date & time 06/29/14  1731 History   First MD Initiated Contact with Patient 06/29/14 2000     Chief Complaint  Patient presents with  . Rash  . Abdominal Pain     (Consider location/radiation/quality/duration/timing/severity/associated sxs/prior Treatment) HPI Comments: Patient presents today with two separate complaints.  She is complaining of left sided pelvic pain and also a rash of the right posterior neck.  She states that she really came to the ED today because of the rash, but she thought that she would mention the pelvic pain while she is here.  She states that the pelvic pain is the same pain that she has had over the past month.  She was seen in the ED on 06/04/14 and diagnosed with a ruptured ovarian cyst.  She states that she was also seen by Gynecology Dr. Phineas Real and had a pelvic ultrasound done again on 3/416, which showed small bilateral ovarian follicles, which met the criteria for PCOS.  She states that she has 800 mg Ibuprofen and also Hydrocodone at home for the pain, but has not been taking anything for pain.  Pain does not radiate.  She states that the pain today is no different than the pain that she has had in the past.  She states that her LMP was 06/27/14 and that she is currently on her menstrual cycle.  She reports that the pain typically worsens when she is on her menstrual cycle.  She denies fever, chills, vaginal discharge, nausea, vomiting, or diarrhea.  Last BM was one week ago, which she reports is normal for her.    She is also complaining of a rash of the right posterior neck.  Rash has been present for the past 2 days.  She states that the area itches and burns.  She has not tried any treatment prior to arrival.  She reports that she did notice a scant amount of discharge from the area earlier today, but states that she is not sure what the discharge looked like.  She denies any new soaps, detergents, lotions, or medications.    The  history is provided by the patient.    Past Medical History  Diagnosis Date  . JRA (juvenile rheumatoid arthritis)   . Scoliosis   . Chlamydia 03/2011, 10/2011  . Migraine   . Asthma   . HSV (herpes simplex virus) anogenital infection 10 after her/'2014   Past Surgical History  Procedure Laterality Date  . Scoliosis surgery  2009   Family History  Problem Relation Age of Onset  . Cancer Other     unsure    History  Substance Use Topics  . Smoking status: Current Every Day Smoker -- 0.50 packs/day    Types: Cigarettes  . Smokeless tobacco: Never Used     Comment: two cigarettes a day  . Alcohol Use: No   OB History    Gravida Para Term Preterm AB TAB SAB Ectopic Multiple Living   0              Review of Systems  All other systems reviewed and are negative.     Allergies  Review of patient's allergies indicates no known allergies.  Home Medications   Prior to Admission medications   Medication Sig Start Date End Date Taking? Authorizing Provider  cephALEXin (KEFLEX) 500 MG capsule Take 1 capsule (500 mg total) by mouth 4 (four) times daily. 06/04/14   Ezequiel Essex, MD  dicyclomine (  BENTYL) 20 MG tablet Take 1 tablet (20 mg total) by mouth 2 (two) times daily. 06/11/14   Arthor Captain, PA-C  HYDROcodone-acetaminophen (NORCO/VICODIN) 5-325 MG per tablet Take 2 tablets by mouth every 4 (four) hours as needed. 06/04/14   [name], MD  meclizine (ANTIVERT) 25 MG tablet Take 1 tablet (25 mg total) by mouth 3 (three) times daily as needed for nausea. 06/11/14   Arthor Captain, PA-C  naproxen (NAPROSYN) 500 MG tablet Take 1 tablet (500 mg total) by mouth 2 (two) times daily. 06/04/14   [name], MD  ondansetron (ZOFRAN) 4 MG tablet Take 1 tablet (4 mg total) by mouth every 6 (six) hours. Patient taking differently: Take 4 mg by mouth every 6 (six) hours as needed for nausea or vomiting.  06/04/14   [name], MD   BP 112/77 mmHg  Pulse 70  Temp(Src)  98.8 F (37.1 C) (Oral)  Resp 20  Ht 5\' 2"  (1.575 m)  Wt 138 lb 14.4 oz (63.005 kg)  BMI 25.40 kg/m2  SpO2 100%  LMP 06/23/2014 Physical Exam  Constitutional: She appears well-developed and well-nourished.  HENT:  Head: Normocephalic and atraumatic.  Mouth/Throat: Oropharynx is clear and moist.  Neck: Normal range of motion. Neck supple.    Cardiovascular: Normal rate, regular rhythm and normal heart sounds.   Pulmonary/Chest: Effort normal and breath sounds normal.  Abdominal: Soft. Bowel sounds are normal. She exhibits no distension and no mass. There is tenderness. There is no rigidity, no rebound, no guarding and no tenderness at McBurney's point.  Mild tenderness to palpation of the LLQ  Genitourinary:  Patient declined pelvic exam  Musculoskeletal: Normal range of motion.  Neurological: She is alert.  Skin: Skin is warm and dry.  Psychiatric: She has a normal mood and affect.  Nursing note and vitals reviewed.   ED Course  Procedures (including critical care time) Labs Review Labs Reviewed  URINALYSIS, ROUTINE W REFLEX MICROSCOPIC - Abnormal; Notable for the following:    Hgb urine dipstick MODERATE (*)    Ketones, ur 40 (*)    All other components within normal limits  CBC WITH DIFFERENTIAL/PLATELET  COMPREHENSIVE METABOLIC PANEL  URINE MICROSCOPIC-ADD ON  POC URINE PREG, ED    Imaging Review No results found.   EKG Interpretation None      MDM   Final diagnoses:  None   Patient presents today with two separate complaints.  She is complaining of LLQ abdominal pain and also a rash of her neck.  She states that he LLQ abdominal pain has been present for the past month.  Pain today is the same pain that she has had in the past.  She had a pelvic ultrasound on 06/04/14, which showed a ruptured ovarian cyst.  She again had an ultrasound on 06/25/14 showing possible PCOS.  She declined pelvic exam today in the ED.  Labs unremarkable.  She is afebrile.  Urine preg  is negative.  Patient instructed to follow up with OB/GYN regarding this pain.  She is also complaining of a rash to the posterior neck.  On exam she has a very small pustule with some surrounding erythema to the right side of her posterior neck.  No fluctuance.  Area consistent with mild cellulitis.  Patient afebrile.  Patient given Rx for Bactrim.  Feel that the patient is stable for discharge.  Return precautions given.       08/25/14, PA-C 06/30/14 0003  08/30/14, MD 06/30/14 [phone]

## 2014-06-29 NOTE — ED Notes (Addendum)
Pt has a spot on the back of her neck that started hurting her yesterday, hurts when her hair rakes across it. Was seen a week ago for a ruptured cyst. Still having abd pain. Vomited once last night but is eating in triage. Stopped menses on March 2 and started bleeding again today.

## 2014-07-15 ENCOUNTER — Institutional Professional Consult (permissible substitution): Payer: Managed Care, Other (non HMO) | Admitting: Interventional Cardiology

## 2014-08-16 ENCOUNTER — Emergency Department (HOSPITAL_COMMUNITY): Payer: Managed Care, Other (non HMO)

## 2014-08-16 ENCOUNTER — Emergency Department (HOSPITAL_COMMUNITY)
Admission: EM | Admit: 2014-08-16 | Discharge: 2014-08-16 | Disposition: A | Discharge status: Home / Self Care | Payer: Managed Care, Other (non HMO) | Attending: Emergency Medicine | Admitting: Emergency Medicine

## 2014-08-16 ENCOUNTER — Encounter (HOSPITAL_COMMUNITY): Payer: Self-pay | Admitting: *Deleted

## 2014-08-16 DIAGNOSIS — Z8619 Personal history of other infectious and parasitic diseases: Secondary | ICD-10-CM | POA: Insufficient documentation

## 2014-08-16 DIAGNOSIS — S6991XA Unspecified injury of right wrist, hand and finger(s), initial encounter: Secondary | ICD-10-CM | POA: Insufficient documentation

## 2014-08-16 DIAGNOSIS — S299XXA Unspecified injury of thorax, initial encounter: Secondary | ICD-10-CM | POA: Insufficient documentation

## 2014-08-16 DIAGNOSIS — Z720 Tobacco use: Secondary | ICD-10-CM | POA: Diagnosis not present

## 2014-08-16 DIAGNOSIS — S3991XA Unspecified injury of abdomen, initial encounter: Secondary | ICD-10-CM | POA: Diagnosis present

## 2014-08-16 DIAGNOSIS — Z8679 Personal history of other diseases of the circulatory system: Secondary | ICD-10-CM | POA: Insufficient documentation

## 2014-08-16 DIAGNOSIS — Z3202 Encounter for pregnancy test, result negative: Secondary | ICD-10-CM | POA: Insufficient documentation

## 2014-08-16 DIAGNOSIS — S301XXA Contusion of abdominal wall, initial encounter: Secondary | ICD-10-CM | POA: Diagnosis not present

## 2014-08-16 DIAGNOSIS — Z791 Long term (current) use of non-steroidal anti-inflammatories (NSAID): Secondary | ICD-10-CM | POA: Insufficient documentation

## 2014-08-16 DIAGNOSIS — Y9389 Activity, other specified: Secondary | ICD-10-CM | POA: Diagnosis not present

## 2014-08-16 DIAGNOSIS — S3992XA Unspecified injury of lower back, initial encounter: Secondary | ICD-10-CM | POA: Insufficient documentation

## 2014-08-16 DIAGNOSIS — J45909 Unspecified asthma, uncomplicated: Secondary | ICD-10-CM | POA: Diagnosis not present

## 2014-08-16 DIAGNOSIS — S29092A Other injury of muscle and tendon of back wall of thorax, initial encounter: Secondary | ICD-10-CM | POA: Insufficient documentation

## 2014-08-16 DIAGNOSIS — Z79899 Other long term (current) drug therapy: Secondary | ICD-10-CM | POA: Diagnosis not present

## 2014-08-16 DIAGNOSIS — Y92092 Bedroom in other non-institutional residence as the place of occurrence of the external cause: Secondary | ICD-10-CM | POA: Insufficient documentation

## 2014-08-16 DIAGNOSIS — M0800 Unspecified juvenile rheumatoid arthritis of unspecified site: Secondary | ICD-10-CM | POA: Insufficient documentation

## 2014-08-16 DIAGNOSIS — Z792 Long term (current) use of antibiotics: Secondary | ICD-10-CM | POA: Diagnosis not present

## 2014-08-16 DIAGNOSIS — Y998 Other external cause status: Secondary | ICD-10-CM | POA: Diagnosis not present

## 2014-08-16 LAB — BASIC METABOLIC PANEL
ANION GAP: 11 (ref 5–15)
BUN: 10 mg/dL (ref 6–23)
CO2: 22 mmol/L (ref 19–32)
Calcium: 9.3 mg/dL (ref 8.4–10.5)
Chloride: 105 mmol/L (ref 96–112)
Creatinine, Ser: 0.59 mg/dL (ref 0.50–1.10)
GFR calc Af Amer: >90 mL/min (ref >90)
GFR calc non Af Amer: >90 mL/min (ref >90)
GLUCOSE: 80 mg/dL (ref 70–99)
POTASSIUM: 3.9 mmol/L (ref 3.5–5.1)
Sodium: 138 mmol/L (ref 135–145)

## 2014-08-16 LAB — CBC
HCT: 41.4 % (ref 36.0–46.0)
Hemoglobin: 14.4 g/dL (ref 12.0–15.0)
MCH: 30.4 pg (ref 26.0–34.0)
MCHC: 34.8 g/dL (ref 30.0–36.0)
MCV: 87.3 fL (ref 78.0–100.0)
Platelets: 203 10*3/uL (ref 150–400)
RBC: 4.74 MIL/uL (ref 3.87–5.11)
RDW: 12.9 % (ref 11.5–15.5)
WBC: 7.8 10*3/uL (ref 4.0–10.5)

## 2014-08-16 LAB — HCG, QUANTITATIVE, PREGNANCY

## 2014-08-16 MED ORDER — MORPHINE SULFATE 4 MG/ML IJ SOLN
4.0000 mg | Freq: Once | INTRAMUSCULAR | Status: AC
  Administered 2014-08-16: 4 mg via INTRAVENOUS
  Filled 2014-08-16: qty 1

## 2014-08-16 MED ORDER — NAPROXEN 500 MG PO TABS: 500.0000 mg | ORAL_TABLET | Freq: Two times a day (BID) | ORAL | Status: DC | PRN

## 2014-08-16 MED ORDER — IOHEXOL 300 MG/ML  SOLN
80.0000 mL | Freq: Once | INTRAMUSCULAR | Status: AC | PRN
  Administered 2014-08-16: 80 mL via INTRAVENOUS

## 2014-08-16 MED ORDER — MORPHINE SULFATE 2 MG/ML IJ SOLN
2.0000 mg | Freq: Once | INTRAMUSCULAR | Status: AC
  Administered 2014-08-16: 2 mg via INTRAVENOUS
  Filled 2014-08-16: qty 1

## 2014-08-16 MED ORDER — METHOCARBAMOL 500 MG PO TABS: 500.0000 mg | ORAL_TABLET | Freq: Two times a day (BID) | ORAL | Status: DC

## 2014-08-16 NOTE — ED Notes (Signed)
Spoke with CT sending for patient now HCG negative.

## 2014-08-16 NOTE — ED Notes (Signed)
IV removed.
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
## 2014-08-16 NOTE — ED Notes (Signed)
Patient refusing in and out cath at this time.

## 2014-08-16 NOTE — ED Notes (Signed)
Informed pt in need of urine sample. Pt states she is unable to provide urine at this time
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
## 2014-08-16 NOTE — ED Notes (Signed)
Called charge, RN and patient will be moved to another POD.   Charge to callback and advise where she would like patient moved.

## 2014-08-16 NOTE — Discharge Instructions (Signed)
Contusion °A contusion is the result of an injury to the skin and underlying tissues and is usually caused by direct trauma. The injury results in the appearance of a bruise on the skin overlying the injured tissues. Contusions cause rupture and bleeding of the small capillaries and blood vessels and affect function, because the bleeding infiltrates muscles, tendons, nerves, or other soft tissues.  °SYMPTOMS  °· Swelling and often a hard lump in the injured area, either superficial or deep. °· Pain and tenderness over the area of the contusion. °· Feeling of firmness when pressure is exerted over the contusion. °· Discoloration under the skin, beginning with redness and progressing to the characteristic "black and blue" bruise. °CAUSES  °A contusion is typically the result of direct trauma. This is often by a blunt object.  °RISK INCREASES WITH: °· Sports that have a high likelihood of trauma (football, boxing, ice hockey, soccer, field hockey, martial arts, basketball, and baseball). °· Sports that make falling from a height likely (high-jumping, pole-vaulting, skating, or gymnastics). °· Any bleeding disorder (hemophilia) or taking medications that affect clotting (aspirin, nonsteroidal anti-inflammatory medications, or warfarin [Coumadin]). °· Inadequate protection of exposed areas during contact sports. °PREVENTION °· Maintain physical fitness: °· Joint and muscle flexibility. °· Strength and endurance. °· Coordination. °· Wear proper protective equipment. Make sure it fits correctly. °PROGNOSIS  °Contusions typically heal without any complications. Healing time varies with the severity of injury and intake of medications that affect clotting. Contusions usually heal in 1 to 4 weeks. °RELATED COMPLICATIONS  °· Damage to nearby nerves or blood vessels, causing numbness, coldness, or paleness. °· Compartment syndrome. °· Bleeding into the soft tissues that leads to disability. °· Infiltrative-type bleeding,  leading to the calcification and impaired function of the injured muscle (rare). °· Prolonged healing time if usual activities are resumed too soon. °· Infection if the skin over the injury site is broken. °· Fracture of the bone underlying the contusion. °· Stiffness in the joint where the injured muscle crosses. °TREATMENT  °Treatment initially consists of resting the injured area as well as medication and ice to reduce inflammation. The use of a compression bandage may also be helpful in minimizing inflammation. As pain diminishes and movement is tolerated, the joint where the affected muscle crosses should be moved to prevent stiffness and the shortening (contracture) of the joint. Movement of the joint should begin as soon as possible. It is also important to work on maintaining strength within the affected muscles. °Occasionally, extra padding over the area of contusion may be recommended before returning to sports, particularly if re-injury is likely.  °MEDICATION  °· If pain relief is necessary these medications are often recommended: °· Nonsteroidal anti-inflammatory medications, such as aspirin and ibuprofen. °· Other minor pain relievers, such as acetaminophen, are often recommended. °· Prescription pain relievers may be given by your caregiver. Use only as directed and only as much as you need. °HEAT AND COLD °· Cold treatment (icing) relieves pain and reduces inflammation. Cold treatment should be applied for 10 to 15 minutes every 2 to 3 hours for inflammation and pain and immediately after any activity that aggravates your symptoms. Use ice packs or an ice massage. (To do an ice massage fill a large styrofoam cup with water and freeze. Tear a small amount of foam from the top so ice protrudes. Massage ice firmly over the injured area in a circle about the size of a softball.) °· Heat treatment may be used prior to   performing the stretching and strengthening activities prescribed by your caregiver,  physical therapist, or athletic trainer. Use a heat pack or a warm soak. SEEK MEDICAL CARE IF:   Symptoms get worse or do not improve despite treatment in a few days.  You have difficulty moving a joint.  Any extremity becomes extremely painful, numb, pale, or cool (This is an emergency!).  Medication produces any side effects (bleeding, upset stomach, or allergic reaction).  Signs of infection (drainage from skin, headache, muscle aches, dizziness, fever, or general ill feeling) occur if skin was broken. Document Released: 04/09/2005 Document Revised: 07/02/2011 Document Reviewed: 07/22/2008 [facility] Patient Information 2015 [place], [place]. This information is not intended to replace advice given to you by your health care provider. Make sure you discuss any questions you have with your health care provider.  Assault, General Assault includes any behavior, whether intentional or reckless, which results in bodily injury to another person and/or damage to property. Included in this would be any behavior, intentional or reckless, that by its nature would be understood (interpreted) by a reasonable person as intent to harm another person or to damage his/her property. Threats may be oral or written. They may be communicated through regular mail, computer, fax, or phone. These threats may be direct or implied. FORMS OF ASSAULT INCLUDE:  Physically assaulting a person. This includes physical threats to inflict physical harm as well as:  Slapping.  Hitting.  Poking.  Kicking.  Punching.  Pushing.  Arson.  Sabotage.  Equipment vandalism.  Damaging or destroying property.  Throwing or hitting objects.  Displaying a weapon or an object that appears to be a weapon in a threatening manner.  Carrying a firearm of any kind.  Using a weapon to harm someone.  Using greater physical size/strength to intimidate another.  Making intimidating or threatening  gestures.  Bullying.  Hazing.  Intimidating, threatening, hostile, or abusive language directed toward another person.  It communicates the intention to engage in violence against that person. And it leads a reasonable person to expect that violent behavior may occur.  Stalking another person. IF IT HAPPENS AGAIN:  Immediately call for emergency help (911 in [place]  If someone poses clear and immediate danger to you, seek legal authorities to have a protective or restraining order put in place.  Less threatening assaults can at least be reported to authorities. STEPS TO TAKE IF A SEXUAL ASSAULT HAS HAPPENED  Go to an area of safety. This may include a shelter or staying with a friend. Stay away from the area where you have been attacked. A large percentage of sexual assaults are caused by a friend, relative or associate.  If medications were given by your caregiver, take them as directed for the full length of time prescribed.  Only take over-the-counter or prescription medicines for pain, discomfort, or fever as directed by your caregiver.  If you have come in contact with a sexual disease, find out if you are to be tested again. If your caregiver is concerned about the HIV/AIDS virus, he/she may require you to have continued testing for several months.  For the protection of your privacy, test results can not be given over the phone. Make sure you receive the results of your test. If your test results are not back during your visit, make an appointment with your caregiver to find out the results. Do not assume everything is normal if you have not heard from your caregiver or the medical facility. It is important  for you to follow up on all of your test results.  File appropriate papers with authorities. This is important in all assaults, even if it has occurred in a family or by a friend. SEEK MEDICAL CARE IF:  You have new problems because of your injuries.  You have problems  that may be because of the medicine you are taking, such as:  Rash.  Itching.  Swelling.  Trouble breathing.  You develop belly (abdominal) pain, feel sick to your stomach (nausea) or are vomiting.  You begin to run a temperature.  You need supportive care or referral to a rape crisis center. These are centers with trained personnel who can help you get through this ordeal. SEEK IMMEDIATE MEDICAL CARE IF:  You are afraid of being threatened, beaten, or abused. In [place] call 911.  You receive new injuries related to abuse.  You develop severe pain in any area injured in the assault or have any change in your condition that concerns you.  You faint or lose consciousness.  You develop chest pain or shortness of breath. Document Released: 04/09/2005 Document Revised: 07/02/2011 Document Reviewed: 11/26/2007 [facility] Patient Information 2015 [place], [place]. This information is not intended to replace advice given to you by your health care provider. Make sure you discuss any questions you have with your health care provider.

## 2015-05-11 ENCOUNTER — Encounter: Payer: Self-pay | Admitting: Women's Health

## 2015-05-11 ENCOUNTER — Ambulatory Visit (INDEPENDENT_AMBULATORY_CARE_PROVIDER_SITE_OTHER): Payer: Managed Care, Other (non HMO) | Admitting: Women's Health

## 2015-05-11 ENCOUNTER — Ambulatory Visit (INDEPENDENT_AMBULATORY_CARE_PROVIDER_SITE_OTHER): Payer: Managed Care, Other (non HMO)

## 2015-05-11 VITALS — BP 120/80 | Ht 62.0 in | Wt 141.0 lb

## 2015-05-11 DIAGNOSIS — Z23 Encounter for immunization: Secondary | ICD-10-CM | POA: Diagnosis not present

## 2015-05-11 DIAGNOSIS — Z30011 Encounter for initial prescription of contraceptive pills: Secondary | ICD-10-CM | POA: Diagnosis not present

## 2015-05-11 DIAGNOSIS — R1031 Right lower quadrant pain: Secondary | ICD-10-CM

## 2015-05-11 DIAGNOSIS — Z113 Encounter for screening for infections with a predominantly sexual mode of transmission: Secondary | ICD-10-CM | POA: Diagnosis not present

## 2015-05-11 DIAGNOSIS — B9689 Other specified bacterial agents as the cause of diseases classified elsewhere: Secondary | ICD-10-CM

## 2015-05-11 DIAGNOSIS — N938 Other specified abnormal uterine and vaginal bleeding: Secondary | ICD-10-CM

## 2015-05-11 DIAGNOSIS — A499 Bacterial infection, unspecified: Secondary | ICD-10-CM | POA: Diagnosis not present

## 2015-05-11 DIAGNOSIS — N760 Acute vaginitis: Secondary | ICD-10-CM

## 2015-05-11 DIAGNOSIS — R350 Frequency of micturition: Secondary | ICD-10-CM | POA: Diagnosis not present

## 2015-05-11 LAB — URINALYSIS W MICROSCOPIC + REFLEX CULTURE
Bilirubin Urine: NEGATIVE
CASTS: NONE SEEN [LPF]
CRYSTALS: NONE SEEN [HPF]
GLUCOSE, UA: NEGATIVE
Ketones, ur: NEGATIVE
NITRITE: POSITIVE — AB
Specific Gravity, Urine: 1.015 (ref 1.001–1.035)
Yeast: NONE SEEN [HPF]
pH: 7 (ref 5.0–8.0)

## 2015-05-11 LAB — WET PREP FOR TRICH, YEAST, CLUE
Trich, Wet Prep: NONE SEEN
Yeast Wet Prep HPF POC: NONE SEEN

## 2015-05-11 LAB — PREGNANCY, URINE: Preg Test, Ur: NEGATIVE

## 2015-05-11 MED ORDER — METRONIDAZOLE 500 MG PO TABS: 500.0000 mg | ORAL_TABLET | Freq: Two times a day (BID) | ORAL | Status: DC

## 2015-05-11 MED ORDER — NORGESTIMATE-ETH ESTRADIOL 0.25-35 MG-MCG PO TABS: 1.0000 | ORAL_TABLET | Freq: Every day | ORAL | Status: DC

## 2015-05-11 NOTE — Progress Notes (Signed)
Patient ID: Diane Glass, female   DOB: [birth year], 25 y.o.   MRN: [id number] Presents with complaint of intense lower left quadrant pain for the past 3 days, irregular menstrual cycle for the last several months, currently bleeding for 3 weeks. Has had increased urinary frequency without pain or burning. Denies constipation or change in elimination, nausea or fever. New partner. Condoms for contraception. Overdue for annual exam. Has rheumatoid arthritis especially and hands and arms.  Exam: Appears comfortable. Abdomen soft without rebound mild discomfort in the lower left quadrant. External genitalia within normal limits, speculum exam moderate amount of a dark menstrual type blood, wet prep positive for moderate clues, TNTC bacteria. GC/Chlamydia culture taken. Bimanual minimal CMT, discomfort with palpation to left lower quadrant, none to right adnexa. Ultrasound: T/V retroverted uterus homogeneous echo pattern. Right ovary normal. Left ovary normal. Fluid in cul-de-sac 13 x 8. No apparent mass in the right or left adnexal. UA: +3 blood, positive nitrites, +2 leukocytes, packed WBCs, packed RBCs, many bacteria. 20-40 Epic. Use. Negative UPT.  Left lower quadrant pain Irregular bleeding STD screen Bacteria vaginosis Contraception management Gardasil  Plan: Reviewed normality of ultrasound, over-the-counter Motrin for discomfort. Bland diet encouraged. Keep scheduled annual exam visit in March with Dr. Audie Box, second gardasil then. First gardasil given today. Aware it is a 3 series vaccine. Flagyl 500 twice daily for 7 days alcohol precautions reviewed. Urine culture pending. Contraception options reviewed would like to try pills. Sprintec prescription, proper use given and reviewed slight risk for blood clots and strokes. Start today take daily call if cycle does not stop. Reviewed importance of condoms especially first month and for infection control. GC/Chlamydia culture pending, HIV, hep B,  C, RPR. Encouraged appointment with rheumatologist.

## 2015-05-11 NOTE — Patient Instructions (Signed)
HPV (Human Papillomavirus) Vaccine--Gardasil-9:  1. Why get vaccinated? Gardasil-9 prevents human papillomavirus (HPV) types that cause many cancers, including:  cervical cancer in females,  vaginal and vulvar cancers in females,  anal cancer in females and males,  throat cancer in females and males, and  penile cancer in males. In addition, Gardasil-9 prevents HPV types that cause genital warts in both females and males. In the [place] about 12,000 women get cervical cancer every year, and about 4,000 women die from it. Gardasil-9 can prevent most of these cases of cervical cancer. Vaccination is not a substitute for cervical cancer screening. This vaccine does not protect against all HPV types that can cause cervical cancer. Women should still get regular Pap tests. HPV infection usually comes from sexual contact, and most people will become infected at some point in their life. About 14 million Americans, including teens, get infected every year. Most infections will go away and not cause serious problems. But thousands of women and men get cancer and diseases from HPV. 2. HPV vaccine Gardasil-9 is an FDA-approved HPV vaccine. It is recommended for both males and females. It is routinely given at 11 or 23 years of age, but it may be given beginning at age 9 years through age 26 years. Three doses of Gardasil-9 are recommended with the second dose given 1-2 months after the first dose and the third dose given 6 months after the first dose. 3. Some people should not get this vaccine  Anyone who has had a severe, life-threatening allergic reaction to a dose of HPV vaccine should not get another dose.  Anyone who has a severe (life threatening) allergy to any component of HPV vaccine should not get the vaccine. Tell your doctor if you have any severe allergies that you know of, including a severe allergy to yeast.  HPV vaccine is not recommended for pregnant women. If you learn that you were  pregnant when you were vaccinated, there is no reason to expect any problems for you or your baby. Any woman who learns she was pregnant when she got Gardasil-9 vaccine is encouraged to contact the manufacturer's registry for HPV vaccination during pregnancy at 1-800-986-8999. Women who are breastfeeding may be vaccinated.  If you have a mild illness, such as a cold, you can probably get the vaccine today. If you are moderately or severely ill, you should probably wait until you recover. Your doctor can advise you. 4. Risks of a vaccine reaction With any medicine, including vaccines, there is a chance of side effects. These are usually mild and go away on their own, but serious reactions are also possible. Most people who get HPV vaccine do not have any serious problems with it. Mild or moderate problems following Gardasil-9:  Reactions in the arm where the shot was given:  Soreness (about 9 people in 10)  Redness or swelling (about 1 person in 3)  Fever:  Mild (100F) (about 1 person in 10)  Moderate (102F) (about 1 person in 65)  Other problems:  Headache (about 1 person in 3) Problems that could happen after any injected vaccine:  People sometimes faint after a medical procedure, including vaccination. Sitting or lying down for about 15 minutes can help prevent fainting, and injuries caused by a fall. Tell your doctor if you feel dizzy, or have vision changes or ringing in the ears.  Some people get severe pain in the shoulder and have difficulty moving the arm where a shot was given. This happens   very rarely.  Any medication can cause a severe allergic reaction. Such reactions from a vaccine are very rare, estimated at about 1 in a million doses, and would happen within a few minutes to a few hours after the vaccination. As with any medicine, there is a very remote chance of a vaccine causing a serious injury or death. The safety of vaccines is always being monitored. For more  information, visit: www.cdc.gov/vaccinesafety/. 5. What if there is a serious reaction? What should I look for? Look for anything that concerns you, such as signs of a severe allergic reaction, very high fever, or unusual behavior. Signs of a severe allergic reaction can include hives, swelling of the face and throat, difficulty breathing, a fast heartbeat, dizziness, and weakness. These would usually start a few minutes to a few hours after the vaccination. What should I do? If you think it is a severe allergic reaction or other emergency that can't wait, call 9-1-1 or get to the nearest hospital. Otherwise, call your doctor. Afterward, the reaction should be reported to the "Vaccine Adverse Event Reporting System" (VAERS). Your doctor might file this report, or you can do it yourself through the VAERS web site at www.vaers.hhs.gov, or by calling 1-800-822-7967. VAERS does not give medical advice. 6. The National Vaccine Injury Compensation Program The National Vaccine Injury Compensation Program (VICP) is a federal program that was created to compensate people who may have been injured by certain vaccines. Persons who believe they may have been injured by a vaccine can learn about the program and about filing a claim by calling 1-800-338-2382 or visiting the VICP website at www.hrsa.gov/vaccinecompensation. There is a time limit to file a claim for compensation. 7. How can I learn more?  Ask your health care provider. He or she can give you the vaccine package insert or suggest other sources of information.  Call your local or state health department.  Contact the Centers for Disease Control and Prevention (CDC):  Call 1-800-232-4636 (1-800-CDC-INFO) or  Visit CDC's website at www.cdc.gov/hpv Vaccine Information Statement HPV Vaccine (Gardasil-9) 07/22/14   This information is not intended to replace advice given to you by your health care provider. Make sure you discuss any questions you  have with your health care provider.   Document Released: 11/04/2013 Document Revised: 08/24/2014 Document Reviewed: 11/04/2013 Elsevier Interactive Patient Education 2016 [employer] Bacterial Vaginosis Bacterial vaginosis is a vaginal infection that occurs when the normal balance of bacteria in the vagina is disrupted. It results from an overgrowth of certain bacteria. This is the most common vaginal infection in women of childbearing age. Treatment is important to prevent complications, especially in pregnant women, as it can cause a premature delivery. CAUSES  Bacterial vaginosis is caused by an increase in harmful bacteria that are normally present in smaller amounts in the vagina. Several different kinds of bacteria can cause bacterial vaginosis. However, the reason that the condition develops is not fully understood. RISK FACTORS Certain activities or behaviors can put you at an increased risk of developing bacterial vaginosis, including:  Having a new sex partner or multiple sex partners.  Douching.  Using an intrauterine device (IUD) for contraception. Women do not get bacterial vaginosis from toilet seats, bedding, swimming pools, or contact with objects around them. SIGNS AND SYMPTOMS  Some women with bacterial vaginosis have no signs or symptoms. Common symptoms include:  Grey vaginal discharge.  A fishlike odor with discharge, especially after sexual intercourse.  Itching or burning of the vagina   and vulva.  Burning or pain with urination. DIAGNOSIS  Your health care provider will take a medical history and examine the vagina for signs of bacterial vaginosis. A sample of vaginal fluid may be taken. Your health care provider will look at this sample under a microscope to check for bacteria and abnormal cells. A vaginal pH test may also be done.  TREATMENT  Bacterial vaginosis may be treated with antibiotic medicines. These may be given in the form of a pill or a vaginal  cream. A second round of antibiotics may be prescribed if the condition comes back after treatment. Because bacterial vaginosis increases your risk for sexually transmitted diseases, getting treated can help reduce your risk for chlamydia, gonorrhea, HIV, and herpes. HOME CARE INSTRUCTIONS   Only take over-the-counter or prescription medicines as directed by your health care provider.  If antibiotic medicine was prescribed, take it as directed. Make sure you finish it even if you start to feel better.  Tell all sexual partners that you have a vaginal infection. They should see their health care provider and be treated if they have problems, such as a mild rash or itching.  During treatment, it is important that you follow these instructions:  Avoid sexual activity or use condoms correctly.  Do not douche.  Avoid alcohol as directed by your health care provider.  Avoid breastfeeding as directed by your health care provider. SEEK MEDICAL CARE IF:   Your symptoms are not improving after 3 days of treatment.  You have increased discharge or pain.  You have a fever. MAKE SURE YOU:   Understand these instructions.  Will watch your condition.  Will get help right away if you are not doing well or get worse. FOR MORE INFORMATION  Centers for Disease Control and Prevention, Division of STD Prevention: www.cdc.gov/std American Sexual Health Association (ASHA): www.ashastd.org    This information is not intended to replace advice given to you by your health care provider. Make sure you discuss any questions you have with your health care provider.   Document Released: 04/09/2005 Document Revised: 04/30/2014 Document Reviewed: 11/19/2012 Elsevier Interactive Patient Education 2016 [employer] Inc[employer]  

## 2015-05-12 LAB — GC/CHLAMYDIA PROBE AMP
CT Probe RNA: NOT DETECTED
GC Probe RNA: NOT DETECTED

## 2015-05-12 LAB — RPR

## 2015-05-12 LAB — HIV ANTIBODY (ROUTINE TESTING W REFLEX): HIV 1&2 Ab, 4th Generation: NONREACTIVE

## 2015-05-12 LAB — HEPATITIS C ANTIBODY: HCV Ab: NEGATIVE

## 2015-05-12 LAB — HEPATITIS B SURFACE ANTIGEN: Hepatitis B Surface Ag: NEGATIVE

## 2015-05-13 ENCOUNTER — Other Ambulatory Visit: Payer: Self-pay | Admitting: Women's Health

## 2015-05-13 LAB — URINE CULTURE

## 2015-05-13 MED ORDER — SULFAMETHOXAZOLE-TRIMETHOPRIM 800-160 MG PO TABS: 1.0000 | ORAL_TABLET | Freq: Two times a day (BID) | ORAL | Status: DC

## 2015-05-16 ENCOUNTER — Other Ambulatory Visit: Payer: Self-pay | Admitting: Women's Health

## 2015-05-16 MED ORDER — CIPROFLOXACIN HCL 250 MG PO TABS: 250.0000 mg | ORAL_TABLET | Freq: Two times a day (BID) | ORAL | Status: DC

## 2015-06-13 ENCOUNTER — Emergency Department (HOSPITAL_COMMUNITY)
Admission: EM | Admit: 2015-06-13 | Discharge: 2015-06-13 | Discharge status: Absent without leave | Payer: Managed Care, Other (non HMO) | Attending: Emergency Medicine | Admitting: Emergency Medicine

## 2015-06-13 NOTE — ED Notes (Signed)
Pt left before triage.

## 2015-06-14 ENCOUNTER — Encounter (HOSPITAL_COMMUNITY): Payer: Self-pay | Admitting: Emergency Medicine

## 2015-06-14 ENCOUNTER — Emergency Department (HOSPITAL_COMMUNITY)
Admission: EM | Admit: 2015-06-14 | Discharge: 2015-06-14 | Disposition: A | Discharge status: Home / Self Care | Payer: Managed Care, Other (non HMO) | Attending: Emergency Medicine | Admitting: Emergency Medicine

## 2015-06-14 ENCOUNTER — Emergency Department (HOSPITAL_COMMUNITY): Payer: Managed Care, Other (non HMO)

## 2015-06-14 DIAGNOSIS — Z8619 Personal history of other infectious and parasitic diseases: Secondary | ICD-10-CM | POA: Diagnosis not present

## 2015-06-14 DIAGNOSIS — W228XXA Striking against or struck by other objects, initial encounter: Secondary | ICD-10-CM | POA: Diagnosis not present

## 2015-06-14 DIAGNOSIS — Z8679 Personal history of other diseases of the circulatory system: Secondary | ICD-10-CM | POA: Diagnosis not present

## 2015-06-14 DIAGNOSIS — Z792 Long term (current) use of antibiotics: Secondary | ICD-10-CM | POA: Diagnosis not present

## 2015-06-14 DIAGNOSIS — Y998 Other external cause status: Secondary | ICD-10-CM | POA: Diagnosis not present

## 2015-06-14 DIAGNOSIS — M069 Rheumatoid arthritis, unspecified: Secondary | ICD-10-CM | POA: Insufficient documentation

## 2015-06-14 DIAGNOSIS — S99921A Unspecified injury of right foot, initial encounter: Secondary | ICD-10-CM | POA: Diagnosis present

## 2015-06-14 DIAGNOSIS — Y9289 Other specified places as the place of occurrence of the external cause: Secondary | ICD-10-CM | POA: Insufficient documentation

## 2015-06-14 DIAGNOSIS — F17210 Nicotine dependence, cigarettes, uncomplicated: Secondary | ICD-10-CM | POA: Insufficient documentation

## 2015-06-14 DIAGNOSIS — J45909 Unspecified asthma, uncomplicated: Secondary | ICD-10-CM | POA: Diagnosis not present

## 2015-06-14 DIAGNOSIS — Y9389 Activity, other specified: Secondary | ICD-10-CM | POA: Diagnosis not present

## 2015-06-14 DIAGNOSIS — S92511A Displaced fracture of proximal phalanx of right lesser toe(s), initial encounter for closed fracture: Secondary | ICD-10-CM | POA: Insufficient documentation

## 2015-06-14 DIAGNOSIS — S92911A Unspecified fracture of right toe(s), initial encounter for closed fracture: Secondary | ICD-10-CM

## 2015-06-14 DIAGNOSIS — Z793 Long term (current) use of hormonal contraceptives: Secondary | ICD-10-CM | POA: Diagnosis not present

## 2015-06-14 HISTORY — DX: Rheumatoid arthritis, unspecified: M06.9

## 2015-06-14 MED ORDER — IBUPROFEN 800 MG PO TABS: 800.0000 mg | ORAL_TABLET | Freq: Three times a day (TID) | ORAL | Status: DC

## 2015-06-14 NOTE — ED Notes (Signed)
Struck right 4th toe on furniture 2 days ago.

## 2015-06-14 NOTE — Discharge Instructions (Signed)
You have been seen today for a toe injury. Your x-ray showed fracture in your right fourth toe. You should use the crutches and the shoe as needed for comfort. Ambulation as tolerated. Ice, elevation, and ibuprofen to reduce inflammation and pain. Follow up with PCP as needed. Return to ED should symptoms worsen.   Emergency Department Resource Guide 1) Find a Doctor and Pay Out of Pocket Although you won't have to find out who is covered by your insurance plan, it is a good idea to ask around and get recommendations. You will then need to call the office and see if the doctor you have chosen will accept you as a new patient and what types of options they offer for patients who are self-pay. Some doctors offer discounts or will set up payment plans for their patients who do not have insurance, but you will need to ask so you aren't surprised when you get to your appointment.  2) Contact Your Local Health Department Not all health departments have doctors that can see patients for sick visits, but many do, so it is worth a call to see if yours does. If you don't know where your local health department is, you can check in your phone book. The CDC also has a tool to help you locate your state's health department, and many state websites also have listings of all of their local health departments.  3) Find a Walk-in Clinic If your illness is not likely to be very severe or complicated, you may want to try a walk in clinic. These are popping up all over the country in pharmacies, drugstores, and shopping centers. They're usually staffed by nurse practitioners or physician assistants that have been trained to treat common illnesses and complaints. They're usually fairly quick and inexpensive. However, if you have serious medical issues or chronic medical problems, these are probably not your best option.  No Primary Care Doctor: - Call Health Connect at  [phone] - they can help you locate a primary care  doctor that  accepts your insurance, provides certain services, etc. - Physician Referral Service- [phone]  Chronic Pain Problems: Organization         Address  Phone   Notes  [facility]  [phone] Patients need to be referred by their primary care doctor.   Medication Assistance: Organization         Address  Phone   Notes  [facility] Medication [facility] [address] [place] [place], [place] [address] [phone] --Must be a resident of [facility] -- Must have NO insurance coverage whatsoever (no Medicaid/ Medicare, etc.) -- The pt. MUST have a primary care doctor that directs their care regularly and follows them in the community   [facility]ist  [phone]   [employer]  [phone]    Agencies that provide inexpensive medical care: Organization         Address  Phone   Notes  [facility]  [phone]   [facility]    [phone]   [facility] [address] [place], [place] [address] [phone]   [facility] [address] [place], [place] [phone]   Planned Parenthood    [phone]   [facility]    [phone]   [facility] and [facility]  [address], [place] Phone:  [phone], Fax:  [phone] Hours  of Operation:  9 am - 6 pm, M-F.  Also accepts Medicaid/Medicare and self-pay.  [facility] for [address] [place], [address], [place] Phone: [phone], Fax: [phone]. Hours of Operation:  8:30 am - 5:30 pm, M-F.  Also accepts Medicaid and self-pay.  [facility] [address] [place] Phone: [phone]   [place], [place], [place] [phone], Ext. [phone] Mondays & Thursdays: 7-9 AM.  First 15 patients are seen on a first come, first serve basis.    [place]  Providers:  Organization         Address  Phone   Notes  [facility] [address], [place] [phone] Also accepts self-pay patients.  [facility] [address], [place]  [phone]   [place], [address], [place] [phone]   [facility] [address] [place] [phone]   Lucianne Lei [address], [place]   [phone] Only accepts [place] Access [place] patients after they have their name applied to their card.   Self-Pay (no insurance) in [facility]:  Organization         Address  Phone   Notes  Sickle Cell Patients, [facility] [place] [phone]   [facility] [place] [phone]   [facility] [place]  [place], [place], [place] [phone]   [facility]/Dr. Osei-Bonsu  [address] [place] or [address], [place] [phone] Phone number for both [place] and [place] locations is the same.  [facility]gent [facility] [address], [place] [phone]   [facility] [address] [place] or [address] [phone]   [facility] [address], [place] [phone], phone; [phone], fax Sees patients 1st and 3rd Saturday of every month.  Must not qualify for public or private insurance (i.e. Medicaid, Medicare, [place] Health Choice, Veterans' Benefits)  Household income should be no more than 200% of the poverty level The clinic cannot treat you if you are pregnant or think you are pregnant  Sexually transmitted diseases are not treated at the clinic.    Dental Care: Organization         Address  Phone  Notes  [facility] Department of [facility] [place] [phone] Accepts children up to age 4 who are enrolled in [place] or [place]; pregnant women with a Medicaid card; and children who have applied for Medicaid or [place] Health Choice, but were declined, whose parents can pay a reduced fee at time of service.  [facility] Department of [facility]  [address], [place] [phone] Accepts children up to age 85 who are enrolled in [place] or [place]; pregnant women with a Medicaid card; and children who have applied for Medicaid or [place] Health Choice, but were declined, whose parents can pay a reduced fee at time of service.  [facility]  [place] [phone] Patients are seen by appointment only. Walk-ins are not accepted. [place] will see  patients 58 years of age and older. Monday - Tuesday (8am-5pm) Most Wednesdays (8:30-5pm) $30 per visit, cash only  [facility] Adult [facility] Access PROGRAM  [address], [facility] [phone] Patients are seen by appointment only. Walk-ins are not accepted. [place] will see patients 67 years of age and older. One Wednesday Evening (Monthly: Volunteer Based).  $30 per visit, cash only  [place]  [phone] for adults; Children under age 67, call Graduate Pediatric Dentistry at [phone]. Children aged 34-14, please call [phone] to request a pediatric application.  Dental services are provided in all areas of dental care including fillings, crowns and bridges, complete and partial dentures, implants, gum treatment, root canals, and extractions. Preventive care is also provided. Treatment is provided to both adults and children. Patients are selected via a lottery and there is often a waiting list.   [facility] [address], [place]  [phone] [email or web address]   [facility]  [address] [place], [place] [phone], Ext. [phone] Second and Fourth Thursday of each month, opens at 6:30 AM; Clinic ends at 9 AM.  Patients are seen on a first-come first-served basis, and a limited number are seen during each clinic.   [facility]  [address] Hillard Danker [place], [place] [phone]   Eligibility Requirements You must have lived in [place], [place], or [place] counties for at least the last three months.   You cannot be eligible for state or federal sponsored [employer], including [employer], [place], or [employer].   You generally cannot be eligible for healthcare insurance through your employer.    How to apply: Eligibility screenings are held every Tuesday and Wednesday afternoon from 1:00 pm until 4:00 pm. You do not need an appointment for the interview!  [facility] [address] [place], [place]   [place]  [place] Department  [place]  [phone]    Behavioral Health Resources in the Community: Intensive Outpatient Programs Organization         Address  Phone  Notes  [place]. [address] [place], [place] [phone]   [facility] Outpatient [address], [place], [place]   ADS: [facility] Drug S[facility] [address] [place], [place]   [place] [address]  [place], [place] or [phone]   Substance Abuse Resources Organization         Address  Phone  Notes  Alcohol and Drug Services  [phone]   [place]  [phone]   [facility] [place]   Chinita Pester  [phone]   Residential & Outpatient Substance Abuse Program  [phone]   Psychological Services Organization         Address  Phone  Notes  [facility]  [place]  [phone]   [place] [address] [place] or [phone]    Mobile Crisis Teams Organization         Address  Phone  Notes  [facility], Mobile Crisis Care Unit  [phone]   [facility]  [address] [place], [place]   [facility] [address] [place] [phone]    Self-Help/Support Groups Organization  Address  Phone             Notes  [place]. of [place] - variety of support groups  [place] Call for more information  Narcotics Anonymous (NA), [facility] [address], [employer] [place]  2 meetings at this location   Special educational needs teacher         Address  Phone  Notes  [facility]AP Residential Treatment [place],    [place]  [phone]   [facility]  [address], [place] 329924, [place], [place]   [place], [place] [phone] Admissions: 8am-3pm M-F  Incentives Substance [place] [address],    [place], [place] [phone]   [facility] [address] [place], [place], [place]   [facility] [address],  [place], [place]   [facility] Programs - Intensive Outpatient [address], [place], [place]   [facility] ([place].,  [place], [place] [phone] or [phone]   [facility] (RTS) [address] [place], [place] Accepts Medicaid  [facility] [address]  [place] [phone] Substance Abuse/Addiction Treatment   [facility] Organization         Address  Phone  Notes  [facility]  [phone]   Domenic Schwab, PhD [address] Arlis Porta [place], [place]   [phone] or [phone]    [place], [place] [phone]   [facility] [address], [place], [place] [phone] Insurance/Medicaid/sponsorship through [facility] and Families [address], [address] [place]                                    [place], [place] [phone] [place] [address], [place] [phone]    Dr. Adele Schilder  [phone]   [facility] Dept. 1) [address], [place] 2) [place] 3)  [place] [address], [place] [phone]  [phone]   [place] [phone] or [phone] (After Hours)

## 2015-06-14 NOTE — ED Notes (Signed)
Patient called in waiting room x 2 with no answer, also checked outside.

## 2015-06-14 NOTE — ED Provider Notes (Signed)
CSN: [id number]     Arrival date & time 06/14/15  1243 History   By signing my name below, I, Freida Busman, attest that this documentation has been prepared under the direction and in the presence of non-physician practitioner, Harolyn Rutherford, PA-C. Electronically Signed: Freida Busman, Scribe. 06/14/2015. 2:24 PM.    Chief Complaint  Patient presents with  . Toe Injury    The history is provided by the patient. No language interpreter was used.    HPI Comments:  Diane Glass is a 25 y.o. female who presents to the Emergency Department complaining of 10/10 constant pain to the right foot/right pinky toe s/p injury 2 days ago. Her toe struck the wooden part of her couch; notes she felt a pop. No alleviating factors noted.  Patient has not tried anything for the pain. Patient denies falls, neck/back pain, neuro deficits, other injuries, or any other complaints.    Past Medical History  Diagnosis Date  . JRA (juvenile rheumatoid arthritis) (HCC)   . Scoliosis   . Chlamydia 03/2011, 10/2011  . Migraine   . Asthma   . HSV (herpes simplex virus) anogenital infection 10 after her/'2014  . Rheumatoid arthritis [facility])    Past Surgical History  Procedure Laterality Date  . Scoliosis surgery  2009   Family History  Problem Relation Age of Onset  . Cancer Other     unsure    Social History  Substance Use Topics  . Smoking status: Current Every Day Smoker -- 0.50 packs/day    Types: Cigarettes  . Smokeless tobacco: Never Used     Comment: two cigarettes a day  . Alcohol Use: No   OB History    [place] Para Term Preterm AB TAB SAB Ectopic Multiple Living   0              Review of Systems  Musculoskeletal: Positive for myalgias and arthralgias.       Right foot  Neurological: Negative for weakness and numbness.    Allergies  Review of patient's allergies indicates no known allergies.  Home Medications   Prior to Admission medications   Medication Sig Start Date End Date  Taking? Authorizing Provider  ciprofloxacin (CIPRO) 250 MG tablet Take 1 tablet (250 mg total) by mouth 2 (two) times daily. 05/16/15   Harrington Challenger, NP  ibuprofen (ADVIL,MOTRIN) 800 MG tablet Take 1 tablet (800 mg total) by mouth 3 (three) times daily. 06/14/15   Orlyn Odonoghue C Saphyra Hutt, PA-C  metroNIDAZOLE (FLAGYL) 500 MG tablet Take 1 tablet (500 mg total) by mouth 2 (two) times daily. 05/11/15   Harrington Challenger, NP  norgestimate-ethinyl estradiol (ORTHO-CYCLEN,SPRINTEC,PREVIFEM) 0.25-35 MG-MCG tablet Take 1 tablet by mouth daily. 05/11/15   Harrington Challenger, NP   BP 120/72 mmHg  Pulse 69  Temp(Src) 97.8 F (36.6 C) (Oral)  Resp 18  SpO2 98%  LMP 05/03/2015 Physical Exam  Constitutional: She is oriented to person, place, and time. She appears well-developed and well-nourished. No distress.  HENT:  Head: Normocephalic and atraumatic.  Eyes: Conjunctivae are normal.  Cardiovascular: Normal rate.   Pulmonary/Chest: Effort normal.  Abdominal: She exhibits no distension.  Musculoskeletal:  Tenderness noted to right 4th and 5th toes. No obvious deformity, angulation, swelling, erythema, or any other abnormalities. CMS intact.  Neurological: She is alert and oriented to person, place, and time.  No sensory deficit.  Skin: Skin is warm and dry.  Psychiatric: She has a normal mood and affect.  Nursing note  and vitals reviewed.   ED Course  Procedures   DIAGNOSTIC STUDIES:  Oxygen Saturation is 98% on RA, normal by my interpretation.    COORDINATION OF CARE:  2:22 PM Pt updated with XR results.  Discussed treatment plan with pt at bedside and pt agreed to plan.  Imaging Review Dg Foot Complete Right  06/14/2015  CLINICAL DATA:  Injury EXAM: RIGHT FOOT COMPLETE - 3+ VIEW COMPARISON:  None. FINDINGS: Minimally displaced oblique fracture in the mid diaphysis of the proximal phalanx of the fourth toe. There is associated soft tissue swelling in the forefoot. Remainder of the bony framework is within  normal limits. IMPRESSION: Acute fracture involving the proximal phalanx of the fourth toe. Electronically Signed   By: Jolaine Click M.D.   On: 06/14/2015 13:59   I have personally reviewed and evaluated these images as part of my medical decision-making.   MDM   Final diagnoses:  Toe injury, right, initial encounter  Phalanx fracture, foot, right, closed, initial encounter    [employer] resents with right toe pain after an injury 2 days ago.   Patient X-Ray shows acute fracture of the right fourth toe. Patient was placed in a postop shoe and the toe was buddy taped; pt already has crutches. Conservative therapy recommended and discussed as well as return precautions. Patient voices understanding of these instructions and agrees with the plan. Pt appears safe for discharge.  I personally performed the services described in this documentation, which was scribed in my presence. The recorded information has been reviewed and is accurate.    Anselm Pancoast, PA-C 06/14/15 1817  Alvira Monday, MD 06/16/15 1201

## 2015-06-27 ENCOUNTER — Encounter: Payer: Managed Care, Other (non HMO) | Admitting: Gynecology

## 2015-11-10 ENCOUNTER — Encounter: Payer: Self-pay | Admitting: Women's Health

## 2015-11-10 ENCOUNTER — Ambulatory Visit (INDEPENDENT_AMBULATORY_CARE_PROVIDER_SITE_OTHER): Payer: PRIVATE HEALTH INSURANCE | Admitting: Women's Health

## 2015-11-10 VITALS — BP 118/80 | Ht 62.0 in | Wt 141.0 lb

## 2015-11-10 DIAGNOSIS — A499 Bacterial infection, unspecified: Secondary | ICD-10-CM | POA: Diagnosis not present

## 2015-11-10 DIAGNOSIS — Z113 Encounter for screening for infections with a predominantly sexual mode of transmission: Secondary | ICD-10-CM | POA: Diagnosis not present

## 2015-11-10 DIAGNOSIS — N760 Acute vaginitis: Secondary | ICD-10-CM | POA: Diagnosis not present

## 2015-11-10 DIAGNOSIS — B9689 Other specified bacterial agents as the cause of diseases classified elsewhere: Secondary | ICD-10-CM

## 2015-11-10 DIAGNOSIS — A609 Anogenital herpesviral infection, unspecified: Secondary | ICD-10-CM | POA: Diagnosis not present

## 2015-11-10 LAB — WET PREP FOR TRICH, YEAST, CLUE
TRICH WET PREP: NONE SEEN
Yeast Wet Prep HPF POC: NONE SEEN

## 2015-11-10 MED ORDER — VALACYCLOVIR HCL 500 MG PO TABS: 500.0000 mg | ORAL_TABLET | Freq: Two times a day (BID) | ORAL | Status: AC

## 2015-11-10 MED ORDER — METRONIDAZOLE 500 MG PO TABS: 500.0000 mg | ORAL_TABLET | Freq: Two times a day (BID) | ORAL | Status: AC

## 2015-11-10 NOTE — Patient Instructions (Signed)

## 2015-11-10 NOTE — Progress Notes (Signed)
Patient ID: Diane Glass, female   DOB: 11/18/1990, 25 y.o.   MRN: [id number] Presents with complaint of HSV outbreak, states rare outbreaks, diagnosed several years ago. Has used Valtrex in the past with good relief of symptoms. Same partner but has had several breakouts. White discharge with minimal itching no odor. Denies urinary symptoms, abdominal pain or fever. Monthly cycle condoms inconsistently.  Exam: Appears well. External genitalia right inner labia cluster HSV appearing lesions, speculum exam moderate white discharge with odor noted, wet prep positive for moderate clues, TNTC bacteria. GC/Chlamydia culture taken.  HSV STD screen Contraception management  Plan: Valtrex 500 twice daily for 3-5 days when necessary. Reviewed if gets in a new relationship to take daily. GC/Chlamydia culture pending, HIV, hep B, C, RPR. Declines other contraception.

## 2015-11-11 LAB — HIV ANTIBODY (ROUTINE TESTING W REFLEX): HIV 1&2 Ab, 4th Generation: NONREACTIVE

## 2015-11-11 LAB — RPR

## 2015-11-11 LAB — GC/CHLAMYDIA PROBE AMP
CT Probe RNA: NOT DETECTED
GC Probe RNA: NOT DETECTED

## 2015-11-11 LAB — HEPATITIS B SURFACE ANTIGEN: HEP B S AG: NEGATIVE

## 2015-11-11 LAB — HEPATITIS C ANTIBODY: HCV Ab: NEGATIVE

## 2016-02-26 DIAGNOSIS — N390 Urinary tract infection, site not specified: Secondary | ICD-10-CM | POA: Insufficient documentation

## 2016-02-26 DIAGNOSIS — F17210 Nicotine dependence, cigarettes, uncomplicated: Secondary | ICD-10-CM | POA: Insufficient documentation

## 2016-02-26 DIAGNOSIS — J029 Acute pharyngitis, unspecified: Secondary | ICD-10-CM | POA: Insufficient documentation

## 2016-02-26 DIAGNOSIS — J45909 Unspecified asthma, uncomplicated: Secondary | ICD-10-CM | POA: Diagnosis not present

## 2016-02-26 DIAGNOSIS — R52 Pain, unspecified: Secondary | ICD-10-CM | POA: Diagnosis present

## 2016-02-26 LAB — CBC
HEMATOCRIT: 37.2 % (ref 35.0–47.0)
Hemoglobin: 12.2 g/dL (ref 12.0–16.0)
MCH: 24.4 pg — ABNORMAL LOW (ref 26.0–34.0)
MCHC: 32.8 g/dL (ref 32.0–36.0)
MCV: 74.4 fL — ABNORMAL LOW (ref 80.0–100.0)
PLATELETS: 327 10*3/uL (ref 150–440)
RBC: 5 MIL/uL (ref 3.80–5.20)
RDW: 19 % — AB (ref 11.5–14.5)
WBC: 9.8 10*3/uL (ref 3.6–11.0)

## 2016-02-26 LAB — POCT PREGNANCY, URINE: PREG TEST UR: NEGATIVE

## 2016-02-26 NOTE — ED Triage Notes (Signed)
"  I think I might have the flu."  Patient reports generalized body aches, sore throat and headache.  Patient also reports having lower abdominal pain.

## 2016-02-27 ENCOUNTER — Emergency Department
Admission: EM | Admit: 2016-02-27 | Discharge: 2016-02-27 | Disposition: A | Discharge status: Home / Self Care | Payer: 59 | Attending: Emergency Medicine | Admitting: Emergency Medicine

## 2016-02-27 DIAGNOSIS — N390 Urinary tract infection, site not specified: Secondary | ICD-10-CM

## 2016-02-27 DIAGNOSIS — R6889 Other general symptoms and signs: Secondary | ICD-10-CM

## 2016-02-27 DIAGNOSIS — R102 Pelvic and perineal pain: Secondary | ICD-10-CM

## 2016-02-27 LAB — URINALYSIS COMPLETE WITH MICROSCOPIC (ARMC ONLY)
Bilirubin Urine: NEGATIVE
Glucose, UA: NEGATIVE mg/dL
Hgb urine dipstick: NEGATIVE
Ketones, ur: NEGATIVE mg/dL
Leukocytes, UA: NEGATIVE
Nitrite: POSITIVE — AB
Protein, ur: NEGATIVE mg/dL
RBC / HPF: NONE SEEN RBC/hpf (ref 0–5)
Specific Gravity, Urine: 1.013 (ref 1.005–1.030)
pH: 7 (ref 5.0–8.0)

## 2016-02-27 LAB — COMPREHENSIVE METABOLIC PANEL
ALK PHOS: 57 U/L (ref 38–126)
ALT: 16 U/L (ref 14–54)
AST: 19 U/L (ref 15–41)
Albumin: 4.6 g/dL (ref 3.5–5.0)
Anion gap: 4 — ABNORMAL LOW (ref 5–15)
BILIRUBIN TOTAL: 0.5 mg/dL (ref 0.3–1.2)
BUN: 12 mg/dL (ref 6–20)
CALCIUM: 9.5 mg/dL (ref 8.9–10.3)
CO2: 28 mmol/L (ref 22–32)
CREATININE: 0.68 mg/dL (ref 0.44–1.00)
Chloride: 109 mmol/L (ref 101–111)
GFR calc non Af Amer: >60 mL/min (ref >60)
Glucose, Bld: 88 mg/dL (ref 65–99)
Potassium: 4.3 mmol/L (ref 3.5–5.1)
SODIUM: 141 mmol/L (ref 135–145)
Total Protein: 7.7 g/dL (ref 6.5–8.1)

## 2016-02-27 LAB — LIPASE, BLOOD: Lipase: 21 U/L (ref 11–51)

## 2016-02-27 LAB — INFLUENZA PANEL BY PCR (TYPE A & B)
INFLBPCR: NEGATIVE
Influenza A By PCR: NEGATIVE

## 2016-02-27 MED ORDER — HYDROCODONE-ACETAMINOPHEN 5-325 MG PO TABS: 1.0000 | ORAL_TABLET | Freq: Four times a day (QID) | ORAL | 0 refills | Status: AC | PRN

## 2016-02-27 MED ORDER — MAGIC MOUTHWASH
10.0000 mL | Freq: Once | ORAL | Status: AC
  Administered 2016-02-27: 10 mL via ORAL
  Filled 2016-02-27: qty 10

## 2016-02-27 MED ORDER — HYDROCODONE-ACETAMINOPHEN 5-325 MG PO TABS
1.0000 | ORAL_TABLET | Freq: Once | ORAL | Status: AC
  Administered 2016-02-27: 1 via ORAL
  Filled 2016-02-27: qty 1

## 2016-02-27 MED ORDER — SULFAMETHOXAZOLE-TRIMETHOPRIM 800-160 MG PO TABS: 1.0000 | ORAL_TABLET | Freq: Two times a day (BID) | ORAL | 0 refills | Status: AC

## 2016-02-27 MED ORDER — SULFAMETHOXAZOLE-TRIMETHOPRIM 800-160 MG PO TABS
1.0000 | ORAL_TABLET | Freq: Once | ORAL | Status: AC
  Administered 2016-02-27: 1 via ORAL
  Filled 2016-02-27: qty 1

## 2016-02-27 MED ORDER — KETOROLAC TROMETHAMINE 30 MG/ML IJ SOLN
60.0000 mg | Freq: Once | INTRAMUSCULAR | Status: DC
  Filled 2016-02-27: qty 2

## 2016-02-27 MED ORDER — IBUPROFEN 800 MG PO TABS: 800.0000 mg | ORAL_TABLET | Freq: Three times a day (TID) | ORAL | 0 refills | Status: AC | PRN

## 2016-02-27 NOTE — Discharge Instructions (Signed)
1. Take antibiotic as prescribed (Septra DS twice daily 7 days). 2. You may take pain medicines as needed (Motrin/Norco #15). 3. Return to the ER for worsening symptoms, persistent vomiting, difficulty breathing or other concerns.

## 2016-02-27 NOTE — ED Provider Notes (Signed)
[facility] Emergency Department Provider Note   ____________________________________________   First MD Initiated Contact with Patient 02/27/16 [phone]     (approximate)  I have reviewed the triage vital signs and the nursing notes.   HISTORY  Chief Complaint Generalized Body Aches and Abdominal Pain    HPI Diane Glass is a 25 y.o. female who presents to the ED from home with a chief complaint of flulike illness and lower abdominal pain. Patient reports a one-day history of generalized body aches, sore throat, headache. Also reports having left lower pelvic pain which she states feels like ovarian cyst pain. Patient has a history of ovarian cysts with rupture; no prior surgery. Also complains of dysuria. Denies associated fever, chills, chest pain, shortness of breath, abdominal pain, nausea, vomiting, vaginal bleeding, diarrhea. Denies recent travel or trauma. Nothing makes her symptoms better or worse.   Past Medical History:  Diagnosis Date  . Asthma   . Chlamydia 03/2011, 10/2011  . HSV (herpes simplex virus) anogenital infection 10 after her/'2014  . JRA (juvenile rheumatoid arthritis) (HCC)   . Migraine   . Rheumatoid arthritis (HCC)   . Scoliosis     Patient Active Problem List   Diagnosis Date Noted  . HSV (herpes simplex virus) anogenital infection 11/10/2015  . Syncope 06/02/2014  . JRA (juvenile rheumatoid arthritis) (HCC)   . Scoliosis     Past Surgical History:  Procedure Laterality Date  . SCOLIOSIS SURGERY  2009    Prior to Admission medications   Medication Sig Start Date End Date Taking? Authorizing Provider  HYDROcodone-acetaminophen (NORCO) 5-325 MG tablet Take 1 tablet by mouth every 6 (six) hours as needed for moderate pain. 02/27/16   Irean Hong, MD  ibuprofen (ADVIL,MOTRIN) 800 MG tablet Take 1 tablet (800 mg total) by mouth every 8 (eight) hours as needed for moderate pain. 02/27/16   Irean Hong, MD  metroNIDAZOLE  (FLAGYL) 500 MG tablet Take 1 tablet (500 mg total) by mouth 2 (two) times daily. 11/10/15   Harrington Challenger, NP  sulfamethoxazole-trimethoprim (BACTRIM DS,SEPTRA DS) 800-160 MG tablet Take 1 tablet by mouth 2 (two) times daily. 02/27/16   Irean Hong, MD  valACYclovir (VALTREX) 500 MG tablet Take 1 tablet (500 mg total) by mouth 2 (two) times daily. For 3-5 days as needed 11/10/15   Harrington Challenger, NP    Allergies Patient has no known allergies.  Family History  Problem Relation Age of Onset  . Cancer Other     unsure     Social History Social History  Substance Use Topics  . Smoking status: Current Every Day Smoker    Packs/day: 0.50    Types: Cigarettes  . Smokeless tobacco: Never Used     Comment: two cigarettes a day  . Alcohol use No    Review of Systems  Constitutional: Positive for generalized body aches. No fever/chills. Eyes: No visual changes. ENT: Positive for sore throat. Cardiovascular: Denies chest pain. Respiratory: Denies shortness of breath. Gastrointestinal: Positive for pelvic pain. No abdominal pain.  No nausea, no vomiting.  No diarrhea.  No constipation. Genitourinary: Negative for dysuria. Musculoskeletal: Negative for back pain. Skin: Negative for rash. Neurological: Negative for headaches, focal weakness or numbness.  10-point ROS otherwise negative.  ____________________________________________   PHYSICAL EXAM:  VITAL SIGNS: ED Triage Vitals [02/26/16 2328]  Enc Vitals Group     BP 124/76     Pulse Rate 96     Resp  20     Temp 98.1 F (36.7 C)     Temp Source Oral     SpO2 100 %     Weight 150 lb (68 kg)     Height 5\' 2"  (1.575 m)     Head Circumference      Peak Flow      Pain Score 10     Pain Loc      Pain Edu?      Excl. in GC?     Constitutional: Asleep, easily awakened for exam. Alert and oriented. Well appearing and in no acute distress. Eyes: Conjunctivae are normal. PERRL. EOMI. Head: Atraumatic. Nose: No  congestion/rhinnorhea. Mouth/Throat: Mucous membranes are moist.  Oropharynx mildly erythematous without tonsillar swelling, exudates or peritonsillar abscess. No hoarse or muffled voice. There is no drooling. Neck: No stridor.   Cardiovascular: Normal rate, regular rhythm. Grossly normal heart sounds.  Good peripheral circulation. Respiratory: Normal respiratory effort.  No retractions. Lungs CTAB. Gastrointestinal: Soft and mildly tender to palpation left pelvis without rebound or guarding. No distention. No abdominal bruits. No CVA tenderness. Musculoskeletal: No lower extremity tenderness nor edema.  No joint effusions. Neurologic:  Normal speech and language. No gross focal neurologic deficits are appreciated. No gait instability. Skin:  Skin is warm, dry and intact. No rash noted. Psychiatric: Mood and affect are normal. Speech and behavior are normal.  ____________________________________________   LABS (all labs ordered are listed, but only abnormal results are displayed)  Labs Reviewed  COMPREHENSIVE METABOLIC PANEL - Abnormal; Notable for the following:       Result Value   Anion gap 4 (*)    All other components within normal limits  CBC - Abnormal; Notable for the following:    MCV 74.4 (*)    MCH 24.4 (*)    RDW 19.0 (*)    All other components within normal limits  URINALYSIS COMPLETEWITH MICROSCOPIC ([facility] ONLY) - Abnormal; Notable for the following:    Color, Urine YELLOW (*)    APPearance HAZY (*)    Nitrite POSITIVE (*)    Bacteria, UA FEW (*)    Squamous Epithelial / LPF 0-5 (*)    All other components within normal limits  LIPASE, BLOOD  INFLUENZA PANEL BY PCR (TYPE A & B, H1N1)  POC URINE PREG, ED  POCT PREGNANCY, URINE   ____________________________________________  EKG  None ____________________________________________  RADIOLOGY  None ____________________________________________   PROCEDURES  Procedure(s) performed:  None  Procedures  Critical Care performed: No  ____________________________________________   INITIAL IMPRESSION / ASSESSMENT AND PLAN / ED COURSE  Pertinent labs & imaging results that were available during my care of the patient were reviewed by me and considered in my medical decision making (see chart for details).  25 year old female who presents with flulike symptoms, left pelvic pain which she is convinced is secondary to ovarian cyst pain. Pelvic deferred per patient's request. Updated patient on laboratory and urinalysis results. Will place on Septra for UTI, NSAIDs, analgesia and she will follow-up with her GYN next week. Strict return precautions given. Patient verbalizes understanding and agrees with plan of care.  Clinical Course      ____________________________________________   FINAL CLINICAL IMPRESSION(S) / ED DIAGNOSES  Final diagnoses:  Flu-like symptoms  Urinary tract infection without hematuria, site unspecified  Pelvic pain      NEW MEDICATIONS STARTED DURING THIS VISIT:  New Prescriptions   HYDROCODONE-ACETAMINOPHEN (NORCO) 5-325 MG TABLET    Take 1 tablet by mouth  every 6 (six) hours as needed for moderate pain.   IBUPROFEN (ADVIL,MOTRIN) 800 MG TABLET    Take 1 tablet (800 mg total) by mouth every 8 (eight) hours as needed for moderate pain.   SULFAMETHOXAZOLE-TRIMETHOPRIM (BACTRIM DS,SEPTRA DS) 800-160 MG TABLET    Take 1 tablet by mouth 2 (two) times daily.     Note:  This document was prepared using Dragon voice recognition software and may include unintentional dictation errors.    Irean Hong, MD 02/27/16 [phone]

## 2016-03-13 ENCOUNTER — Emergency Department (HOSPITAL_COMMUNITY): Payer: 59

## 2016-03-13 ENCOUNTER — Emergency Department (HOSPITAL_COMMUNITY)
Admission: EM | Admit: 2016-03-13 | Discharge: 2016-03-13 | Disposition: A | Discharge status: Home / Self Care | Payer: 59 | Attending: Emergency Medicine | Admitting: Emergency Medicine

## 2016-03-13 ENCOUNTER — Encounter (HOSPITAL_COMMUNITY): Payer: Self-pay | Admitting: Emergency Medicine

## 2016-03-13 DIAGNOSIS — R52 Pain, unspecified: Secondary | ICD-10-CM

## 2016-03-13 DIAGNOSIS — M542 Cervicalgia: Secondary | ICD-10-CM

## 2016-03-13 DIAGNOSIS — J45909 Unspecified asthma, uncomplicated: Secondary | ICD-10-CM | POA: Insufficient documentation

## 2016-03-13 DIAGNOSIS — F17210 Nicotine dependence, cigarettes, uncomplicated: Secondary | ICD-10-CM | POA: Insufficient documentation

## 2016-03-13 DIAGNOSIS — M546 Pain in thoracic spine: Secondary | ICD-10-CM | POA: Insufficient documentation

## 2016-03-13 MED ORDER — IBUPROFEN 600 MG PO TABS: 600.0000 mg | ORAL_TABLET | Freq: Four times a day (QID) | ORAL | 0 refills | Status: AC | PRN

## 2016-03-13 MED ORDER — METHOCARBAMOL 500 MG PO TABS
500.0000 mg | ORAL_TABLET | Freq: Once | ORAL | Status: AC
Start: 2016-03-13 — End: 2016-03-13
  Administered 2016-03-13: 500 mg via ORAL
  Filled 2016-03-13: qty 1

## 2016-03-13 MED ORDER — METHOCARBAMOL 500 MG PO TABS: 500.0000 mg | ORAL_TABLET | Freq: Every evening | ORAL | 0 refills | Status: AC | PRN

## 2016-03-13 NOTE — ED Triage Notes (Signed)
Pt sts left sided neck pain since running into a gate 5 days ago

## 2016-03-13 NOTE — ED Provider Notes (Signed)
[facility]-EMERGENCY DEPT Provider Note   CSN: [id number] Arrival date & time: 03/13/16  1741  By signing my name below, I, Linna Darner, attest that this documentation has been prepared under the direction and in the presence of Terance Hart, PA-C. Electronically Signed: Linna Darner, Scribe. 03/13/2016. 6:50 PM.  History   Chief Complaint Chief Complaint  Patient presents with  . Neck Pain    The history is provided by the patient. No language interpreter was used.     HPI Comments: Diane Glass is a 25 y.o. female who presents to the Emergency Department complaining of sudden onset, constant, left-sided neck pain beginning 4 days ago. She states she walked into a metal fence when it was dark outside and struck the left side of her neck. She reports part of the fence "went into her neck" momentarily and she bled mildly from the site afterwards. She notes associated left trapezius pain as well as pain radiation into her upper back. She reports some weakness of her left upper extremity and states it is difficult to hold her head up due to neck pain. Tetanus status unknown. She notes a h/o scoliosis but denies pain from this condition. She denies trouble swallowing, trismus, numbness, fever, chills, nausea, vomiting, or any other associated symptoms.   Past Medical History:  Diagnosis Date  . Asthma   . Chlamydia 03/2011, 10/2011  . HSV (herpes simplex virus) anogenital infection 10 after her/'2014  . JRA (juvenile rheumatoid arthritis) (HCC)   . Migraine   . Rheumatoid arthritis (HCC)   . Scoliosis     Patient Active Problem List   Diagnosis Date Noted  . HSV (herpes simplex virus) anogenital infection 11/10/2015  . Syncope 06/02/2014  . JRA (juvenile rheumatoid arthritis) (HCC)   . Scoliosis     Past Surgical History:  Procedure Laterality Date  . SCOLIOSIS SURGERY  2009    OB History    Gravida Para Term Preterm AB Living   0             SAB TAB Ectopic Multiple  Live Births                   Home Medications    Prior to Admission medications   Medication Sig Start Date End Date Taking? Authorizing Provider  HYDROcodone-acetaminophen (NORCO) 5-325 MG tablet Take 1 tablet by mouth every 6 (six) hours as needed for moderate pain. 02/27/16   Irean Hong, MD  ibuprofen (ADVIL,MOTRIN) 800 MG tablet Take 1 tablet (800 mg total) by mouth every 8 (eight) hours as needed for moderate pain. 02/27/16   Irean Hong, MD  metroNIDAZOLE (FLAGYL) 500 MG tablet Take 1 tablet (500 mg total) by mouth 2 (two) times daily. 11/10/15   Harrington Challenger, NP  sulfamethoxazole-trimethoprim (BACTRIM DS,SEPTRA DS) 800-160 MG tablet Take 1 tablet by mouth 2 (two) times daily. 02/27/16   Irean Hong, MD  valACYclovir (VALTREX) 500 MG tablet Take 1 tablet (500 mg total) by mouth 2 (two) times daily. For 3-5 days as needed 11/10/15   Harrington Challenger, NP    Family History Family History  Problem Relation Age of Onset  . Cancer Other     unsure     Social History Social History  Substance Use Topics  . Smoking status: Current Every Day Smoker    Packs/day: 0.50    Types: Cigarettes  . Smokeless tobacco: Never Used     Comment: two cigarettes a day  .  Alcohol use No     Allergies   Patient has no known allergies.   Review of Systems Review of Systems  Constitutional: Negative for chills and fever.  HENT: Negative for trouble swallowing.   Gastrointestinal: Negative for nausea and vomiting.  Musculoskeletal: Positive for back pain (upper), myalgias (left trapezius) and neck pain (left side).  Neurological: Positive for weakness (left upper extremity). Negative for numbness.     Physical Exam Updated Vital Signs BP 104/70 (BP Location: Right Arm)   Pulse 92   Temp 98.5 F (36.9 C) (Oral)   Resp 18   LMP 01/23/2016 (Approximate)   SpO2 99%   Physical Exam  Constitutional: She is oriented to person, place, and time. She appears well-developed and well-nourished.  No distress.  HENT:  Head: Normocephalic and atraumatic.  No trismus  Eyes: Conjunctivae and EOM are normal.  Neck: Normal range of motion. Neck supple. No tracheal deviation present.  Small abrasion noted on left lateral neck which does not appear infected. Pain with ROM of neck but she is able to flex, extend, and lateral rotate.   Cardiovascular: Normal rate.   Pulmonary/Chest: Effort normal. No respiratory distress.  Musculoskeletal: Normal range of motion.  Midline tenderness of mid back. 5/5 strength of lower extremities. Patient is ambulatory.  Neurological: She is alert and oriented to person, place, and time.  Skin: Skin is warm and dry.  Psychiatric: She has a normal mood and affect. Her behavior is normal.  Nursing note and vitals reviewed.    ED Treatments / Results  Labs (all labs ordered are listed, but only abnormal results are displayed) Labs Reviewed - No data to display  EKG  EKG Interpretation None       Radiology No results found.  Procedures Procedures (including critical care time)  DIAGNOSTIC STUDIES: Oxygen Saturation is 99% on RA, normal by my interpretation.    COORDINATION OF CARE: 7:00 PM Discussed treatment plan with pt at bedside and pt agreed to plan.  Medications Ordered in ED Medications  methocarbamol (ROBAXIN) tablet 500 mg (500 mg Oral Given 03/13/16 2106)     Initial Impression / Assessment and Plan / ED Course  I have reviewed the triage vital signs and the nursing notes.  Pertinent labs & imaging results that were available during my care of the patient were reviewed by me and considered in my medical decision making (see chart for details).  Clinical Course    25 year old female with MSK pain. Xrays are negative. Will treat for strain/spasms. Shared visit with Dr. Hyacinth Meeker. Patient is requesting dose of muscle relaxer here. She states she will get a ride home. Dose given here and rx given. Patient is NAD, non-toxic, with  stable VS. Patient is informed of clinical course, understands medical decision making process, and agrees with plan. Opportunity for questions provided and all questions answered. Return precautions given.   I personally performed the services described in this documentation, which was scribed in my presence. The recorded information has been reviewed and is accurate.   Final Clinical Impressions(s) / ED Diagnoses   Final diagnoses:  Neck pain  Acute midline thoracic back pain    New Prescriptions Discharge Medication List as of 03/13/2016  8:47 PM    START taking these medications   Details  !! ibuprofen (ADVIL,MOTRIN) 600 MG tablet Take 1 tablet (600 mg total) by mouth every 6 (six) hours as needed., Starting Tue 03/13/2016, Print    methocarbamol (ROBAXIN) 500 MG  tablet Take 1 tablet (500 mg total) by mouth at bedtime and may repeat dose one time if needed., Starting Tue 03/13/2016, Print     !! - Potential duplicate medications found. Please discuss with provider.       Bethel Born, PA-C 03/13/16 2131    Eber Hong, MD 03/14/16 2129

## 2016-03-13 NOTE — ED Notes (Signed)
Pt requesting dose oif Robaxi before leaving. Explained to pt she would have to have someone come to the hospital to pick her up because she cannot drive after taking Robaxin. Pt verbalized understanding and confirmed that she would call someone to pick her up and drive her home safely.

## 2016-03-13 NOTE — ED Provider Notes (Signed)
Has sligth injury to skin 4 niths ago when the gate at her apt complex stuck her in neck as she turned into the gate and a piece of stray metal from the gate stuck her L neck - now having pain in the L shoulder girdle and rhomboid area.  On exam has pain with ROM of the shoulder - no ttp over the abrasion area on the neck - there is no puncture, no laceration and no signs of redness, swelling or ttp over this injury - there is pain with ROM of the neck (in the shoulder).  There is no crepitance, / emphysema of the skin or redness, warmth or swelling.  She has supple joints except for the L shoulder.  Muscle spasm, no other acute findings Stable for d/c  Robaxin Motrin Home, Pt in agreement.  Medical screening examination/treatment/procedure(s) were conducted as a shared visit with non-physician practitioner(s) and myself.  I personally evaluated the patient during the encounter.  Clinical Impression:   Final diagnoses:  Neck pain  Acute midline thoracic back pain         Eber Hong, MD 03/14/16 2129

## 2016-03-14 ENCOUNTER — Emergency Department (HOSPITAL_COMMUNITY)
Admission: EM | Admit: 2016-03-14 | Discharge: 2016-03-14 | Disposition: A | Discharge status: Home / Self Care | Payer: 59 | Attending: Physician Assistant | Admitting: Physician Assistant

## 2016-03-14 ENCOUNTER — Encounter (HOSPITAL_COMMUNITY): Payer: Self-pay | Admitting: Emergency Medicine

## 2016-03-14 DIAGNOSIS — M542 Cervicalgia: Secondary | ICD-10-CM

## 2016-03-14 DIAGNOSIS — Y999 Unspecified external cause status: Secondary | ICD-10-CM | POA: Diagnosis not present

## 2016-03-14 DIAGNOSIS — Y9301 Activity, walking, marching and hiking: Secondary | ICD-10-CM | POA: Insufficient documentation

## 2016-03-14 DIAGNOSIS — J45909 Unspecified asthma, uncomplicated: Secondary | ICD-10-CM | POA: Diagnosis not present

## 2016-03-14 DIAGNOSIS — Y9289 Other specified places as the place of occurrence of the external cause: Secondary | ICD-10-CM | POA: Diagnosis not present

## 2016-03-14 DIAGNOSIS — W228XXA Striking against or struck by other objects, initial encounter: Secondary | ICD-10-CM | POA: Diagnosis not present

## 2016-03-14 DIAGNOSIS — F17210 Nicotine dependence, cigarettes, uncomplicated: Secondary | ICD-10-CM | POA: Diagnosis not present

## 2016-03-14 MED ORDER — TETANUS-DIPHTH-ACELL PERTUSSIS 5-2.5-18.5 LF-MCG/0.5 IM SUSP
0.5000 mL | Freq: Once | INTRAMUSCULAR | Status: AC
  Administered 2016-03-14: 0.5 mL via INTRAMUSCULAR
  Filled 2016-03-14: qty 0.5

## 2016-03-14 NOTE — Discharge Instructions (Signed)
Please read attached information. If you experience any new or worsening signs or symptoms please return to the emergency room for evaluation. Please follow-up with your primary care provider as discussed. °

## 2016-03-14 NOTE — ED Triage Notes (Signed)
Patient reports persistent left lateral neck pain radiating to left shoulder muscle and left lower back injured last week after she accidentally hit a metal gate . Alert and oriented/ambulatory, respirations unlabored . She was seen here last night prescribed with Ibuprofen and Robaxin .

## 2016-03-14 NOTE — ED Provider Notes (Signed)
[facility]-EMERGENCY DEPT Provider Note   CSN: [id number] Arrival date & time: 03/14/16  0505     History   Chief Complaint Chief Complaint  Patient presents with  . Neck Pain  . Shoulder Pain  . Back Pain    HPI Diane Glass is a 25 y.o. female.  HPI   25 year old female presents today with complaints of left-sided neck pain. Patient notes that approximately 5 days ago she cut her neck on a metal gate. She reports minor bleeding, but did not seek medical attention. She notes she was seen yesterday in the emergency room because she developed pain 1 day after the incident to the lateral aspect of the neck, trapezius, and left shoulder. She denies any significant trauma to the elbow, denies any distal neurological deficits, denies any fever or chills, neck stiffness, jaw pain, or any other systemic symptoms. Patient tetanus status is unknown. She reports ibuprofen does not provide significant relief all sleeping at night. Muscle relaxers do not improve her symptoms.   Past Medical History:  Diagnosis Date  . Asthma   . Chlamydia 03/2011, 10/2011  . HSV (herpes simplex virus) anogenital infection 10 after her/'2014  . JRA (juvenile rheumatoid arthritis) (HCC)   . Migraine   . Rheumatoid arthritis (HCC)   . Scoliosis     Patient Active Problem List   Diagnosis Date Noted  . HSV (herpes simplex virus) anogenital infection 11/10/2015  . Syncope 06/02/2014  . JRA (juvenile rheumatoid arthritis) (HCC)   . Scoliosis     Past Surgical History:  Procedure Laterality Date  . SCOLIOSIS SURGERY  2009    OB History    Gravida Para Term Preterm AB Living   0             SAB TAB Ectopic Multiple Live Births                   Home Medications    Prior to Admission medications   Medication Sig Start Date End Date Taking? Authorizing Provider  ibuprofen (ADVIL,MOTRIN) 600 MG tablet Take 1 tablet (600 mg total) by mouth every 6 (six) hours as needed. 03/13/16  Yes Bethel Born, PA-C  methocarbamol (ROBAXIN) 500 MG tablet Take 1 tablet (500 mg total) by mouth at bedtime and may repeat dose one time if needed. 03/13/16  Yes Bethel Born, PA-C  HYDROcodone-acetaminophen (NORCO) 5-325 MG tablet Take 1 tablet by mouth every 6 (six) hours as needed for moderate pain. Patient not taking: Reported on 03/14/2016 02/27/16   Irean Hong, MD  ibuprofen (ADVIL,MOTRIN) 800 MG tablet Take 1 tablet (800 mg total) by mouth every 8 (eight) hours as needed for moderate pain. Patient not taking: Reported on 03/14/2016 02/27/16   Irean Hong, MD  metroNIDAZOLE (FLAGYL) 500 MG tablet Take 1 tablet (500 mg total) by mouth 2 (two) times daily. Patient not taking: Reported on 03/14/2016 11/10/15   Harrington Challenger, NP  sulfamethoxazole-trimethoprim (BACTRIM DS,SEPTRA DS) 800-160 MG tablet Take 1 tablet by mouth 2 (two) times daily. Patient not taking: Reported on 03/14/2016 02/27/16   Irean Hong, MD  valACYclovir (VALTREX) 500 MG tablet Take 1 tablet (500 mg total) by mouth 2 (two) times daily. For 3-5 days as needed Patient not taking: Reported on 03/14/2016 11/10/15   Harrington Challenger, NP    Family History Family History  Problem Relation Age of Onset  . Cancer Other     unsure  Social History Social History  Substance Use Topics  . Smoking status: Current Every Day Smoker    Packs/day: 0.50    Types: Cigarettes  . Smokeless tobacco: Never Used     Comment: two cigarettes a day  . Alcohol use No     Allergies   Patient has no known allergies.   Review of Systems Review of Systems  All other systems reviewed and are negative.    Physical Exam Updated Vital Signs BP 113/76   Pulse 80   Temp 97.5 F (36.4 C) (Oral)   Resp 18   Ht 5' 2.25" (1.581 m)   Wt 69.4 kg   LMP 02/23/2016   SpO2 100%   BMI 27.76 kg/m   Physical Exam  Constitutional: She is oriented to person, place, and time. She appears well-developed and well-nourished.  HENT:  Head:  Normocephalic and atraumatic.  Eyes: Conjunctivae are normal. Pupils are equal, round, and reactive to light. Right eye exhibits no discharge. Left eye exhibits no discharge. No scleral icterus.  Neck: Normal range of motion. No JVD present. No tracheal deviation present.  Pulmonary/Chest: Effort normal. No stridor.  Musculoskeletal:  Tenderness palpation of the left lateral neck, trapezius and left shoulder. Well-healed wound to the lateral neck, no signs of infection. Full active range of motion of the left shoulder, distal sensation intact. Neck supple full active range of motion. Jaw supple full active range of motion  Neurological: She is alert and oriented to person, place, and time. Coordination normal.  Psychiatric: She has a normal mood and affect. Her behavior is normal. Judgment and thought content normal.  Nursing note and vitals reviewed.    ED Treatments / Results  Labs (all labs ordered are listed, but only abnormal results are displayed) Labs Reviewed - No data to display  EKG  EKG Interpretation None       Radiology Dg Cervical Spine Complete  Result Date: 03/13/2016 CLINICAL DATA:  Acute onset of left-sided neck pain. Initial encounter. EXAM: CERVICAL SPINE - COMPLETE 4+ VIEW COMPARISON:  None. FINDINGS: There is no evidence of fracture or subluxation. Mild reversal of the normal lordotic curvature of the cervical spine is likely positional in nature. Vertebral bodies demonstrate normal height and alignment. Intervertebral disc spaces are preserved. Prevertebral soft tissues are within normal limits. The provided odontoid view demonstrates no significant abnormality. Thoracic spinal fusion rods are partially imaged. The visualized lung apices are clear. IMPRESSION: No evidence of fracture or subluxation along the cervical spine. Electronically Signed   By: Roanna Raider M.D.   On: 03/13/2016 19:44   Dg Thoracic Spine 2 View  Result Date: 03/13/2016 CLINICAL DATA:   Acute onset of left-sided upper back pain after walking into metal fence. Initial encounter. EXAM: THORACIC SPINE 2 VIEWS COMPARISON:  Chest radiograph from 05/17/2008 FINDINGS: There is no evidence of fracture or subluxation. Vertebral bodies demonstrate normal height and alignment. Intervertebral disc spaces are preserved. Slight right convex thoracic scoliosis is noted, with associated spinal fusion hardware. The visualized portions of both lungs are clear. The mediastinum is unremarkable in appearance. IMPRESSION: No evidence of fracture or subluxation along the thoracic spine. Electronically Signed   By: Roanna Raider M.D.   On: 03/13/2016 19:49   Dg Shoulder Left  Result Date: 03/13/2016 CLINICAL DATA:  Acute onset of left shoulder pain after walking into metal fence. Initial encounter. EXAM: LEFT SHOULDER - 2+ VIEW COMPARISON:  Chest radiograph performed 08/16/2014 FINDINGS: There is no evidence of fracture  or dislocation. The left humeral head is seated within the glenoid fossa. Mild elevation of the distal left clavicle with respect to the acromion may reflect a [place] type II acromioclavicular joint injury, whether acute or chronic in nature. No significant soft tissue abnormalities are seen. The visualized portions of the left lung are clear. IMPRESSION: 1. No evidence of fracture or dislocation. 2. Mild elevation of the distal left clavicle with respect to the acromion may reflect a [place] type II acromioclavicular joint injury, whether acute or chronic in nature. Electronically Signed   By: Roanna Raider M.D.   On: 03/13/2016 19:47    Procedures Procedures (including critical care time)  Medications Ordered in ED Medications  Tdap (BOOSTRIX) injection 0.5 mL (0.5 mLs Intramuscular Given 03/14/16 0731)     Initial Impression / Assessment and Plan / ED Course  I have reviewed the triage vital signs and the nursing notes.  Pertinent labs & imaging results that were available  during my care of the patient were reviewed by me and considered in my medical decision making (see chart for details).  Clinical Course     Final Clinical Impressions(s) / ED Diagnoses   Final diagnoses:  Neck pain    Labs:  Imaging:  Consults:  Therapeutics:  Discharge Meds:   Assessment/Plan:  25 year old female presents today with likely musculoskeletal pain. Patient had very minor wound, no signs of infection. She has no signs of systemic illness. Patient uncertain of her tetanus status, tetanus will be updated here. Patient encouraged to continue using ibuprofen, heat, follow-up with primary care for reevaluation        New Prescriptions Discharge Medication List as of 03/14/2016  7:42 AM       Eyvonne Mechanic, PA-C 03/14/16 1702    Courteney Lyn Mackuen, MD 03/22/16 [id number]

## 2016-08-23 IMAGING — US US ART/VEN ABD/PELV/SCROTUM DOPPLER LTD
1 series · 13 of 25 positions shown · non-contrast
Comparison: June 24, 2012

CLINICAL DATA: Pelvic pain and pressure, primarily on right

EXAM:
TRANSABDOMINAL AND TRANSVAGINAL ULTRASOUND OF PELVIS
DOPPLER ULTRASOUND OF OVARIES
TECHNIQUE: Study was performed transabdominally to optimize pelvic field of
view evaluation and transvaginally to optimize pelvic visceral
architecture evaluation.
Color and duplex Doppler ultrasound was utilized to evaluate blood
flow to the ovaries.

[Series 1: us art/ven abd/pelv/scrotum doppler ltd · 0.22mm/px · 13 of 57 slices shown]
[im 1/57]
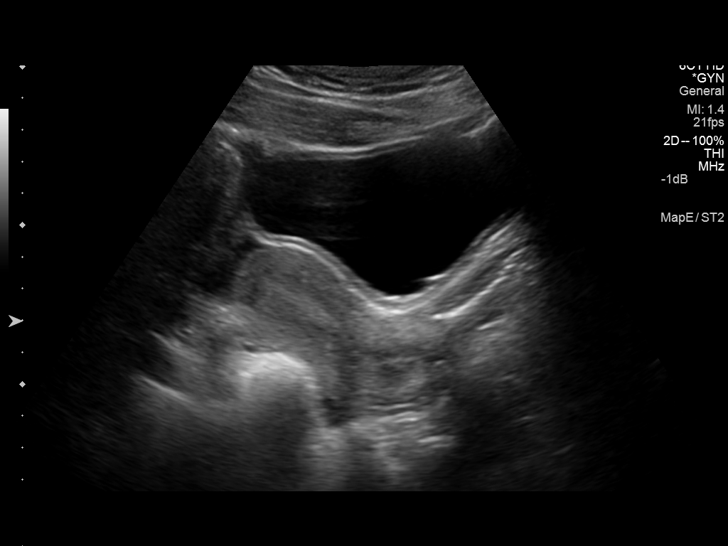
[im 5/57]
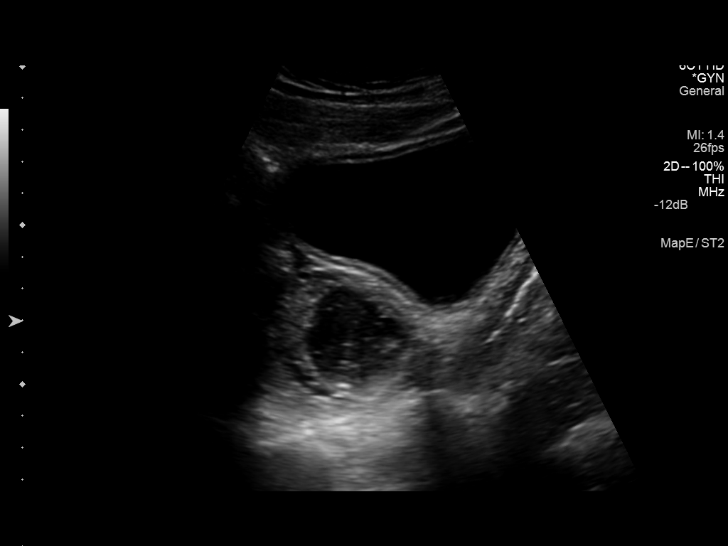
[im 10/57]
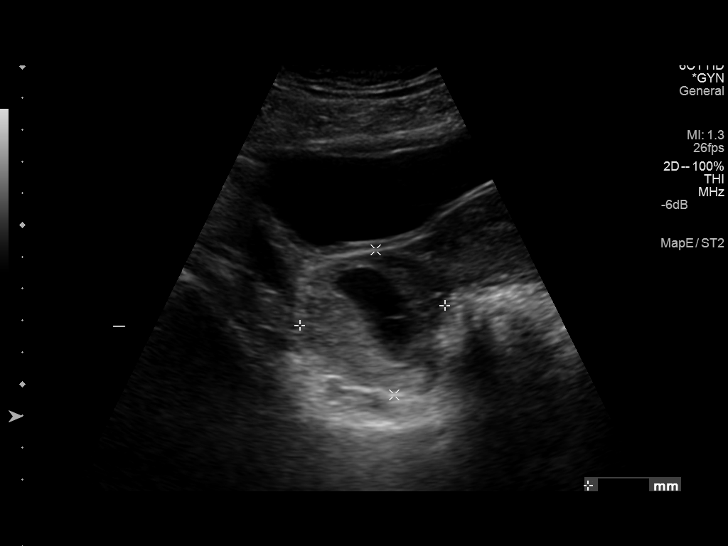
[im 15/57]
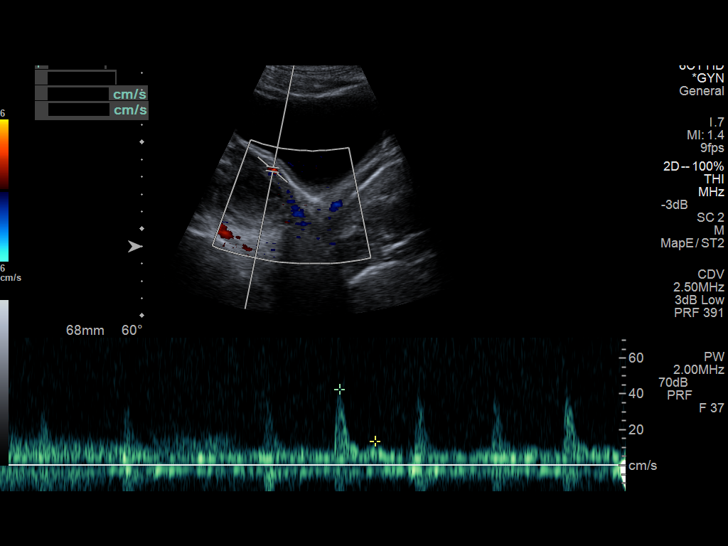
[im 19/57]
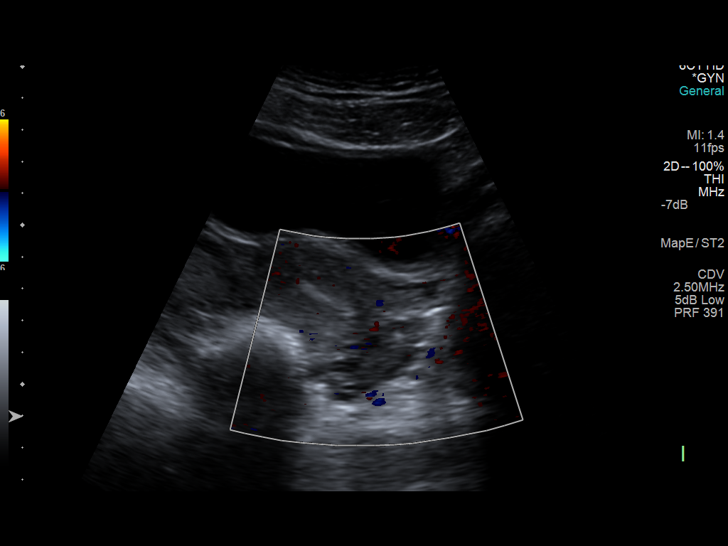
[im 24/57]
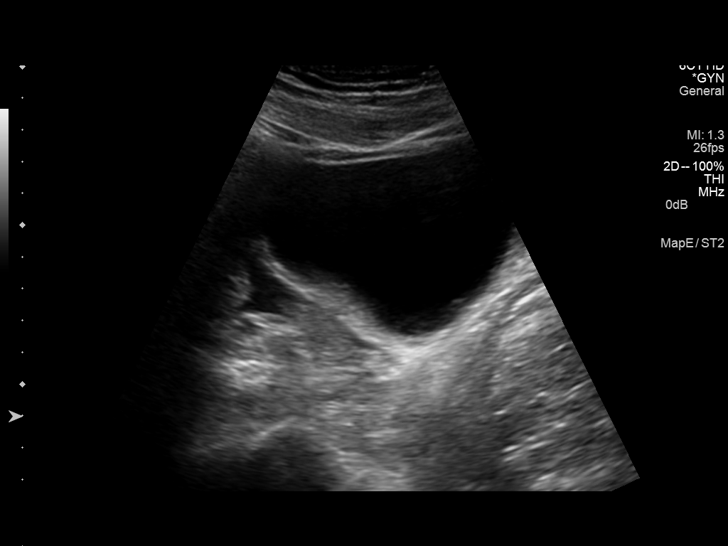
[im 29/57]
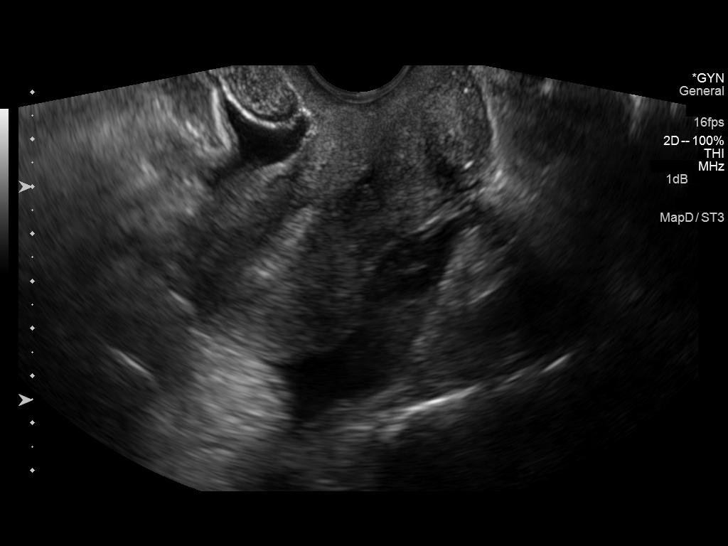
[im 33/57]
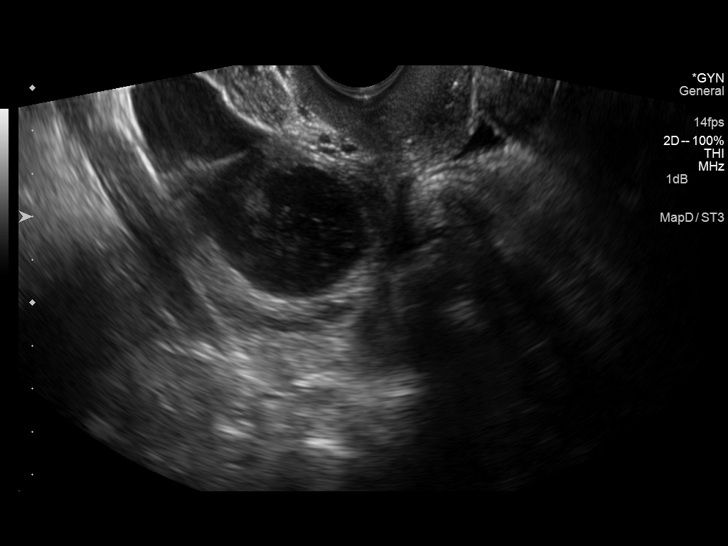
[im 38/57]
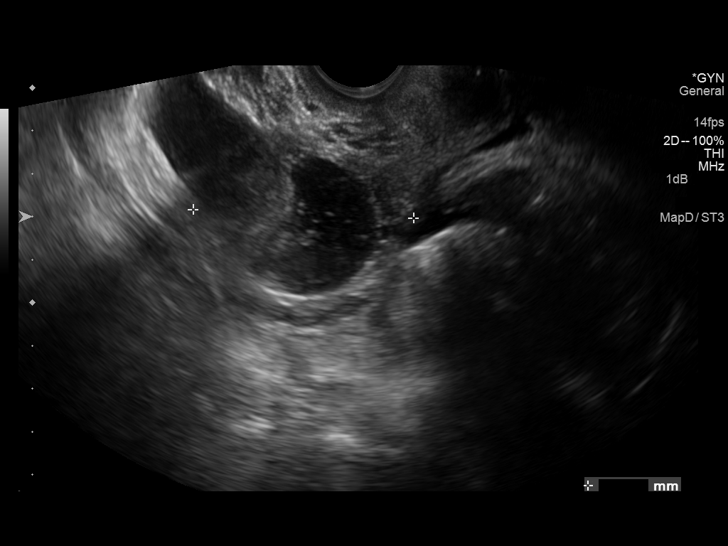
[im 43/57]
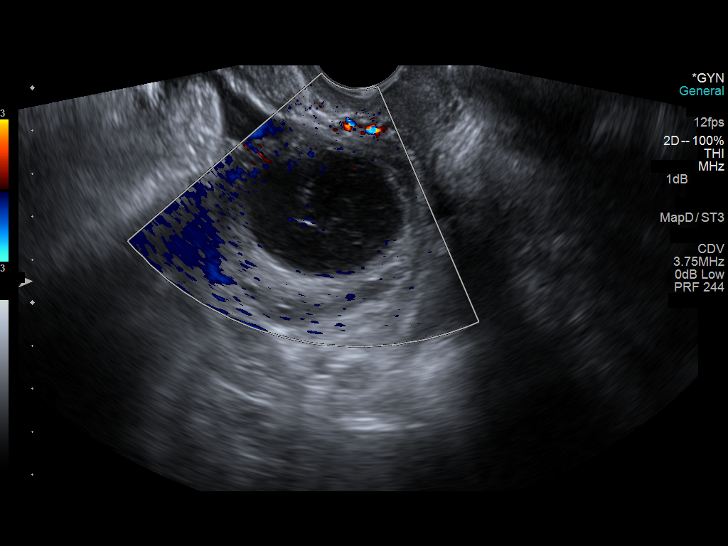
[im 47/57]
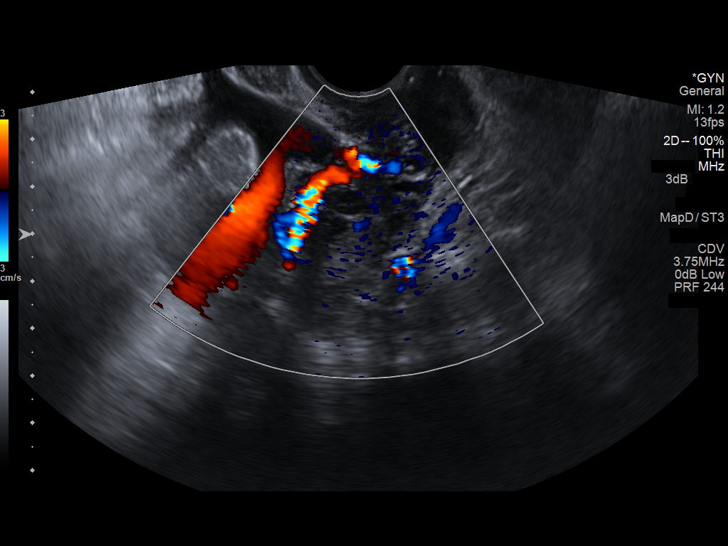
[im 52/57]
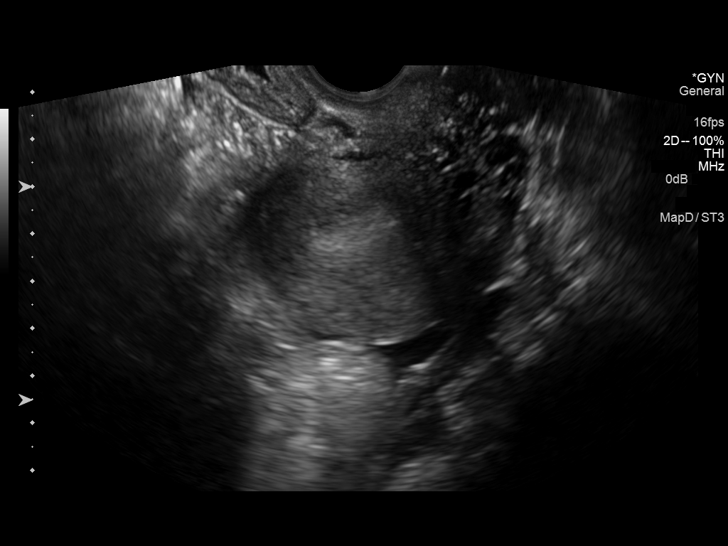
[im 57/57]
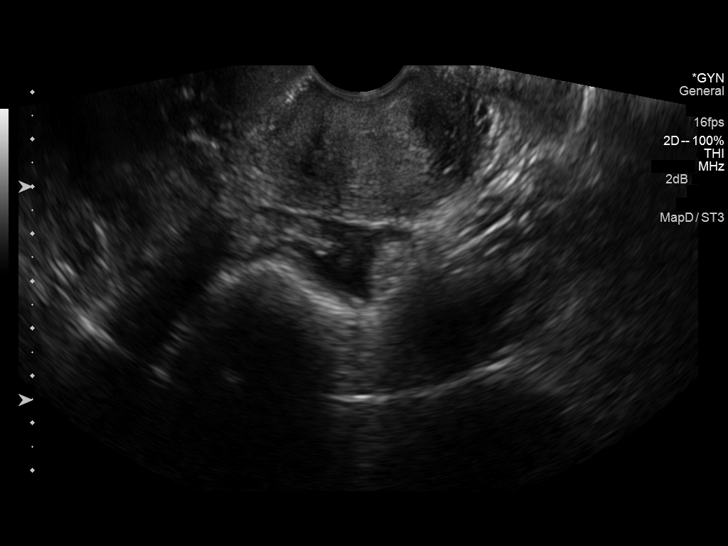

[13 of 25 positions shown; findings below may reference images not displayed]

FINDINGS: Uterus

Measurements: 7.8 x 3.4 x 5.0 cm. No fibroids or other mass
visualized.

Endometrium

Thickness: 13 mm. No focal abnormality visualized. Endometrial
contour is smooth

Right ovary

Measurements: 5.1 x 4.7 x 5.1 cm. Within the right ovary, there is a
somewhat complex mass measuring 3.6 x 3.0 x 3.0 cm. No other right
adnexal mass.

Left ovary

Measurements: 3.9 x 2.0 x 3.4 cm. Normal appearance/no adnexal mass.

Pulsed Doppler evaluation of both ovaries demonstrates normal
low-resistance arterial and venous waveforms. The peak systolic
velocity in the right ovary is 18 cm/sec. Peak systolic velocity in
the left ovary is 11 cm/sec.

Other findings

There is moderate free fluid is seen primarily superior to the
uterus.
IMPRESSION: Complex right ovarian mass, most likely a hemorrhagic cyst. An
endometrioma could present somewhat similarly. Short-interval follow
up ultrasound in 6-12 weeks is recommended, preferably during the
week following the patient's normal menses.

Moderate free fluid in the pelvis. Question recent ovarian cyst
rupture.

No ovarian torsion.  Uterus unremarkable in appearance.

## 2019-01-12 ENCOUNTER — Encounter: Payer: Self-pay | Admitting: Gynecology
